# Patient Record
Sex: Female | Born: 1990 | Race: Black or African American | Hispanic: No | Marital: Single | Smoking: Never smoker
Health system: Southern US, Community
[De-identification: ages and names within clinical notes are randomized; demographics above are authoritative.]

## PROBLEM LIST (undated history)

## (undated) ENCOUNTER — Inpatient Hospital Stay (HOSPITAL_COMMUNITY): Payer: Self-pay

## (undated) DIAGNOSIS — Z789 Other specified health status: Secondary | ICD-10-CM

## (undated) DIAGNOSIS — I10 Essential (primary) hypertension: Secondary | ICD-10-CM

## (undated) DIAGNOSIS — Z3490 Encounter for supervision of normal pregnancy, unspecified, unspecified trimester: Secondary | ICD-10-CM

## (undated) HISTORY — PX: TONGUE SURGERY: SHX810

## (undated) HISTORY — DX: Essential (primary) hypertension: I10

## (undated) HISTORY — DX: Other specified health status: Z78.9

---

## 2015-09-12 ENCOUNTER — Encounter (HOSPITAL_COMMUNITY): Payer: Self-pay | Admitting: Emergency Medicine

## 2015-09-12 ENCOUNTER — Emergency Department (HOSPITAL_COMMUNITY): Payer: Self-pay

## 2015-09-12 ENCOUNTER — Emergency Department (HOSPITAL_COMMUNITY)
Admission: EM | Admit: 2015-09-12 | Discharge: 2015-09-12 | Disposition: A | Discharge status: Home / Self Care | Payer: Self-pay | Attending: Emergency Medicine | Admitting: Emergency Medicine

## 2015-09-12 DIAGNOSIS — S4991XA Unspecified injury of right shoulder and upper arm, initial encounter: Secondary | ICD-10-CM | POA: Insufficient documentation

## 2015-09-12 DIAGNOSIS — O9A212 Injury, poisoning and certain other consequences of external causes complicating pregnancy, second trimester: Secondary | ICD-10-CM | POA: Insufficient documentation

## 2015-09-12 DIAGNOSIS — Z3201 Encounter for pregnancy test, result positive: Secondary | ICD-10-CM

## 2015-09-12 DIAGNOSIS — Z3492 Encounter for supervision of normal pregnancy, unspecified, second trimester: Secondary | ICD-10-CM

## 2015-09-12 DIAGNOSIS — Y92410 Unspecified street and highway as the place of occurrence of the external cause: Secondary | ICD-10-CM | POA: Insufficient documentation

## 2015-09-12 DIAGNOSIS — Z3A16 16 weeks gestation of pregnancy: Secondary | ICD-10-CM | POA: Insufficient documentation

## 2015-09-12 DIAGNOSIS — S199XXA Unspecified injury of neck, initial encounter: Secondary | ICD-10-CM | POA: Insufficient documentation

## 2015-09-12 DIAGNOSIS — Y998 Other external cause status: Secondary | ICD-10-CM | POA: Insufficient documentation

## 2015-09-12 DIAGNOSIS — S3991XA Unspecified injury of abdomen, initial encounter: Secondary | ICD-10-CM | POA: Insufficient documentation

## 2015-09-12 DIAGNOSIS — S4992XA Unspecified injury of left shoulder and upper arm, initial encounter: Secondary | ICD-10-CM | POA: Insufficient documentation

## 2015-09-12 DIAGNOSIS — Y9389 Activity, other specified: Secondary | ICD-10-CM | POA: Insufficient documentation

## 2015-09-12 HISTORY — DX: Encounter for supervision of normal pregnancy, unspecified, unspecified trimester: Z34.90

## 2015-09-12 LAB — CBC WITH DIFFERENTIAL/PLATELET
Basophils Absolute: 0 10*3/uL (ref 0.0–0.1)
Basophils Relative: 0 %
EOS ABS: 0.4 10*3/uL (ref 0.0–0.7)
Eosinophils Relative: 5 %
HEMATOCRIT: 33.5 % — AB (ref 36.0–46.0)
HEMOGLOBIN: 10.9 g/dL — AB (ref 12.0–15.0)
LYMPHS ABS: 2.1 10*3/uL (ref 0.7–4.0)
Lymphocytes Relative: 27 %
MCH: 27 pg (ref 26.0–34.0)
MCHC: 32.5 g/dL (ref 30.0–36.0)
MCV: 83.1 fL (ref 78.0–100.0)
MONO ABS: 0.4 10*3/uL (ref 0.1–1.0)
MONOS PCT: 6 %
NEUTROS ABS: 4.9 10*3/uL (ref 1.7–7.7)
NEUTROS PCT: 62 %
Platelets: 249 10*3/uL (ref 150–400)
RBC: 4.03 MIL/uL (ref 3.87–5.11)
RDW: 13.1 % (ref 11.5–15.5)
WBC: 7.8 10*3/uL (ref 4.0–10.5)

## 2015-09-12 LAB — URINALYSIS, ROUTINE W REFLEX MICROSCOPIC
BILIRUBIN URINE: NEGATIVE
Glucose, UA: NEGATIVE mg/dL
KETONES UR: NEGATIVE mg/dL
Leukocytes, UA: NEGATIVE
NITRITE: NEGATIVE
PH: 6 (ref 5.0–8.0)
Protein, ur: NEGATIVE mg/dL
Specific Gravity, Urine: 1.019 (ref 1.005–1.030)

## 2015-09-12 LAB — URINE MICROSCOPIC-ADD ON

## 2015-09-12 LAB — COMPREHENSIVE METABOLIC PANEL
ALK PHOS: 55 U/L (ref 38–126)
ALT: 10 U/L — ABNORMAL LOW (ref 14–54)
ANION GAP: 9 (ref 5–15)
AST: 13 U/L — ABNORMAL LOW (ref 15–41)
Albumin: 3.3 g/dL — ABNORMAL LOW (ref 3.5–5.0)
BILIRUBIN TOTAL: 0.2 mg/dL — AB (ref 0.3–1.2)
BUN: 7 mg/dL (ref 6–20)
CALCIUM: 8.8 mg/dL — AB (ref 8.9–10.3)
CO2: 21 mmol/L — ABNORMAL LOW (ref 22–32)
Chloride: 105 mmol/L (ref 101–111)
Creatinine, Ser: 0.48 mg/dL (ref 0.44–1.00)
GFR calc non Af Amer: >60 mL/min (ref >60)
Glucose, Bld: 101 mg/dL — ABNORMAL HIGH (ref 65–99)
Potassium: 3.4 mmol/L — ABNORMAL LOW (ref 3.5–5.1)
Sodium: 135 mmol/L (ref 135–145)
TOTAL PROTEIN: 6.6 g/dL (ref 6.5–8.1)

## 2015-09-12 LAB — HCG, QUANTITATIVE, PREGNANCY: HCG, BETA CHAIN, QUANT, S: 19022 m[IU]/mL — AB (ref <5)

## 2015-09-12 LAB — LIPASE, BLOOD: LIPASE: 25 U/L (ref 11–51)

## 2015-09-12 MED ORDER — ACETAMINOPHEN 325 MG PO TABS: 325.0000 mg | ORAL_TABLET | Freq: Four times a day (QID) | ORAL | Status: DC | PRN

## 2015-09-12 MED ORDER — ACETAMINOPHEN 325 MG PO TABS
325.0000 mg | ORAL_TABLET | Freq: Once | ORAL | Status: AC
  Administered 2015-09-12: 325 mg via ORAL
  Filled 2015-09-12: qty 1

## 2015-09-12 NOTE — ED Notes (Signed)
Bed: [id number] Expected date:  Expected time:  Means of arrival:  Comments: Hold for RM 27: Dr.Gramig coming
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
## 2015-09-13 NOTE — L&D Delivery Note (Signed)
[name] ; [name] , 9; [name] , via shultz.  Cord:3 vc  with the following complications: none.  Cord pH: n/a  Anesthesia:  none Episiotomy:  none Lacerations:  none Suture Repair: none Est. Blood Loss100 (mL):    Mom to postpartum.  Baby to Couplet care / Skin to Skin.  Cindy [name] 01/31/2016, 8:34 PM

## 2015-10-06 ENCOUNTER — Inpatient Hospital Stay (HOSPITAL_COMMUNITY)
Admission: AD | Admit: 2015-10-06 | Discharge: 2015-10-06 | Disposition: A | Discharge status: Home / Self Care | Payer: Medicaid Other | Source: Ambulatory Visit | Attending: Obstetrics and Gynecology | Admitting: Obstetrics and Gynecology

## 2015-10-06 ENCOUNTER — Encounter (HOSPITAL_COMMUNITY): Payer: Self-pay | Admitting: *Deleted

## 2015-10-06 DIAGNOSIS — O26892 Other specified pregnancy related conditions, second trimester: Secondary | ICD-10-CM | POA: Insufficient documentation

## 2015-10-06 DIAGNOSIS — R12 Heartburn: Secondary | ICD-10-CM | POA: Diagnosis not present

## 2015-10-06 DIAGNOSIS — O9989 Other specified diseases and conditions complicating pregnancy, childbirth and the puerperium: Secondary | ICD-10-CM

## 2015-10-06 DIAGNOSIS — R109 Unspecified abdominal pain: Secondary | ICD-10-CM | POA: Diagnosis present

## 2015-10-06 DIAGNOSIS — Z3A20 20 weeks gestation of pregnancy: Secondary | ICD-10-CM | POA: Diagnosis not present

## 2015-10-06 DIAGNOSIS — O26899 Other specified pregnancy related conditions, unspecified trimester: Secondary | ICD-10-CM

## 2015-10-06 LAB — URINE MICROSCOPIC-ADD ON

## 2015-10-06 LAB — URINALYSIS, ROUTINE W REFLEX MICROSCOPIC
BILIRUBIN URINE: NEGATIVE
GLUCOSE, UA: NEGATIVE mg/dL
Ketones, ur: NEGATIVE mg/dL
NITRITE: NEGATIVE
PH: 5 (ref 5.0–8.0)
Protein, ur: NEGATIVE mg/dL

## 2015-10-06 MED ORDER — FAMOTIDINE 20 MG PO TABS: 20.0000 mg | ORAL_TABLET | Freq: Two times a day (BID) | ORAL | Status: DC

## 2015-10-06 NOTE — MAU Provider Note (Signed)
History     CSN: [id number]  Arrival date and time: 10/06/15 1744   First Provider Initiated Contact with Patient 10/06/15 1919         Chief Complaint  Patient presents with  . Abdominal Pain  . Heartburn   Abdominal Cramping Episode onset: x 1 week. The problem occurs 2 to 4 times per day. The problem has been unchanged. The pain is located in the suprapubic region, LLQ and RLQ. The pain is at a severity of 0/10. The patient is experiencing no pain. The quality of the pain is cramping. The abdominal pain does not radiate. Pertinent negatives include no constipation, diarrhea, dysuria, fever, frequency, hematuria, nausea or vomiting. Nothing aggravates the pain. She has tried nothing for the symptoms.  Heartburn She complains of abdominal pain and heartburn. She reports no chest pain, no coughing or no nausea. Episode onset: x 1 week. The problem occurs frequently. The problem has been unchanged. The heartburn duration is an hour. The heartburn is located in the abdomen (epigastric). The heartburn does not wake her from sleep. The heartburn does not limit her activity. The heartburn doesn't change with position. The symptoms are aggravated by certain foods and lying down. Risk factors include caffeine use and obesity. She has tried nothing for the symptoms.   OB History    Gravida Para Term Preterm AB TAB SAB Ectopic Multiple Living   1 0              Past Medical History  Diagnosis Date  . Pregnancy     Past Surgical History  Procedure Laterality Date  . Tongue surgery      when pt was 25 years old    History reviewed. No pertinent family history.  Social History  Substance Use Topics  . Smoking status: Never Smoker   . Smokeless tobacco: None  . Alcohol Use: No     Comment: occ    Allergies: No Known Allergies  Prescriptions prior to admission  Medication Sig Dispense Refill Last Dose  . acetaminophen (TYLENOL) 325 MG tablet Take 1 tablet (325 mg total) by mouth  every 6 (six) hours as needed. 30 tablet 0     Review of Systems  Constitutional: Negative.  Negative for fever.  Respiratory: Negative for cough.   Cardiovascular: Negative for chest pain.  Gastrointestinal: Positive for heartburn and abdominal pain. Negative for nausea, vomiting, diarrhea and constipation.       No symptoms today  Genitourinary: Negative.  Negative for dysuria, frequency and hematuria.   Physical Exam   Blood pressure 132/72, pulse 88, temperature 98.4 F (36.9 C), temperature source Oral, resp. rate 18, height  (1.702 m), weight 240 lb (108.863 kg), last menstrual period 06/12/2015.  Physical Exam  Nursing note and vitals reviewed. Constitutional: She is oriented to person, place, and time. She appears well-developed and well-nourished. No distress.  HENT:  Head: Normocephalic and atraumatic.  Eyes: Conjunctivae are normal. Right eye exhibits no discharge. Left eye exhibits no discharge. No scleral icterus.  Neck: Normal range of motion.  Cardiovascular: Normal rate, regular rhythm and normal heart sounds.   No murmur heard. Respiratory: Effort normal and breath sounds normal. No respiratory distress. She has no wheezes.  GI: Soft. Bowel sounds are normal. There is no tenderness.  Neurological: She is alert and oriented to person, place, and time.  Skin: Skin is warm and dry. She is not diaphoretic.  Psychiatric: She has a normal mood and affect. Her behavior  is normal. Judgment and thought content normal.   Dilation: Closed Effacement (%): Thick Cervical Position: Posterior Station: -3 Exam by:: Judeth Horn NP  MAU Course  Procedures Results for orders placed or performed during the hospital encounter of 10/06/15 (from the past 24 hour(s))  Urinalysis, Routine w reflex microscopic (not at [facility])     Status: Abnormal   Collection Time: 10/06/15  6:11 PM  Result Value Ref Range   Color, Urine STRAW (A) YELLOW   APPearance CLEAR CLEAR   Specific  Gravity, Urine <1.005 (L) 1.005 - 1.030   pH 5.0 5.0 - 8.0   Glucose, UA NEGATIVE NEGATIVE mg/dL   Hgb urine dipstick SMALL (A) NEGATIVE   Bilirubin Urine NEGATIVE NEGATIVE   Ketones, ur NEGATIVE NEGATIVE mg/dL   Protein, ur NEGATIVE NEGATIVE mg/dL   Nitrite NEGATIVE NEGATIVE   Leukocytes, UA SMALL (A) NEGATIVE  Urine microscopic-add on     Status: Abnormal   Collection Time: 10/06/15  6:11 PM  Result Value Ref Range   Squamous Epithelial / LPF 0-5 (A) NONE SEEN   WBC, UA 6-30 0 - 5 WBC/hpf   RBC / HPF 6-30 0 - 5 RBC/hpf   Bacteria, UA FEW (A) NONE SEEN    MDM FHT 145 by doppler Patient denies any symptoms at this time Cervix closed Assessment and Plan  A: 1. Heartburn during pregnancy in second trimester   2. Abdominal cramping affecting pregnancy    P: Discharge home Message sent to clinic to schedule prenatal care Discussed reasons to return to MAU Rx pepcid Discussed diet changes to improve heartburn symptoms Outpatient anatomy ultrasound ordered  Judeth Horn, NP  10/06/2015, 7:18 PM

## 2015-10-06 NOTE — MAU Note (Signed)
Pt states started having intermittent cramping in her lower abdomen  and bad heartburn for 1 week.

## 2015-10-06 NOTE — Discharge Instructions (Signed)
Heartburn During Pregnancy Heartburn is a burning sensation in the chest caused by stomach acid backing up into the esophagus. Heartburn is common in pregnancy because a certain hormone (progesterone) is released when a woman is pregnant. The progesterone hormone may relax the valve that separates the esophagus from the stomach. This allows acid to go up into the esophagus, causing heartburn. Heartburn may also happen in pregnancy because the enlarging uterus pushes up on the stomach, which pushes more acid into the esophagus. This is especially true in the later stages of pregnancy. Heartburn problems usually go away after giving birth. CAUSES  Heartburn is caused by stomach acid backing up into the esophagus. During pregnancy, this may result from various things, including:   The progesterone hormone.  Changing hormone levels.  The growing uterus pushing stomach acid upward.  Large meals.  Certain foods and drinks.  Exercise.  Increased acid production. SIGNS AND SYMPTOMS   Burning pain in the chest or lower throat.  Bitter taste in the mouth.  Coughing. DIAGNOSIS  Your health care provider will typically diagnose heartburn by taking a careful history of your concern. Blood tests may be done to check for a certain type of bacteria that is associated with heartburn. Sometimes, heartburn is diagnosed by prescribing a heartburn medicine to see if the symptoms improve. In some cases, a procedure called an endoscopy may be done. In this procedure, a tube with a light and a camera on the end (endoscope) is used to examine the esophagus and the stomach. TREATMENT  Treatment will vary depending on the severity of your symptoms. Your health care provider may recommend:  Over-the-counter medicines (antacids, acid reducers) for mild heartburn.  Prescription medicines to decrease stomach acid or to protect your stomach lining.  Certain changes in your diet.  Elevating the head of your bed  by putting blocks under the legs. This helps prevent stomach acid from backing up into the esophagus when you are lying down. HOME CARE INSTRUCTIONS   Only take over-the-counter or prescription medicines as directed by your health care provider.  Raise the head of your bed by putting blocks under the legs if instructed to do so by your health care provider. Sleeping with more pillows is not effective because it only changes the position of your head.  Do not exercise right after eating.  Avoid eating 2-3 hours before bed. Do not lie down right after eating.  Eat small meals throughout the day instead of three large meals.  Identify foods and beverages that make your symptoms worse and avoid them. Foods you may want to avoid include:  Peppers.  Chocolate.  High-fat foods, including fried foods.  Spicy foods.  Garlic and onions.  Citrus fruits, including oranges, grapefruit, lemons, and limes.  Food containing tomatoes or tomato products.  Mint.  Carbonated and caffeinated drinks.  Vinegar. SEEK MEDICAL CARE IF:  You have abdominal pain of any kind.  You feel burning in your upper abdomen or chest, especially after eating or lying down.  You have nausea and vomiting.  Your stomach feels upset after you eat. SEEK IMMEDIATE MEDICAL CARE IF:   You have severe chest pain that goes down your arm or into your jaw or neck.  You feel sweaty, dizzy, or light-headed.  You become short of breath.  You vomit blood.  You have difficulty or pain with swallowing.  You have bloody or black, tarry stools.  You have episodes of heartburn more than 3 times a week,  for more than 2 weeks. MAKE SURE YOU:  Understand these instructions.  Will watch your condition.  Will get help right away if you are not doing well or get worse.   This information is not intended to replace advice given to you by your health care provider. Make sure you discuss any questions you have with  your health care provider.   Document Released: 08/26/2000 Document Revised: 09/19/2014 Document Reviewed: 04/17/2013 [employer] Interactive Patient Education 2016 [employer] Second Trimester of Pregnancy The second trimester is from week 13 through week 28, months 4 through 6. The second trimester is often a time when you feel your best. Your body has also adjusted to being pregnant, and you begin to feel better physically. Usually, morning sickness has lessened or quit completely, you may have more energy, and you may have an increase in appetite. The second trimester is also a time when the fetus is growing rapidly. At the end of the sixth month, the fetus is about 9 inches long and weighs about 1 pounds. You will likely begin to feel the baby move (quickening) between 18 and 20 weeks of the pregnancy. BODY CHANGES Your body goes through many changes during pregnancy. The changes vary from woman to woman.   Your weight will continue to increase. You will notice your lower abdomen bulging out.  You may begin to get stretch marks on your hips, abdomen, and breasts.  You may develop headaches that can be relieved by medicines approved by your health care provider.  You may urinate more often because the fetus is pressing on your bladder.  You may develop or continue to have heartburn as a result of your pregnancy.  You may develop constipation because certain hormones are causing the muscles that push waste through your intestines to slow down.  You may develop hemorrhoids or swollen, bulging veins (varicose veins).  You may have back pain because of the weight gain and pregnancy hormones relaxing your joints between the bones in your pelvis and as a result of a shift in weight and the muscles that support your balance.  Your breasts will continue to grow and be tender.  Your gums may bleed and may be sensitive to brushing and flossing.  Dark spots or blotches (chloasma, mask of  pregnancy) may develop on your face. This will likely fade after the baby is born.  A dark line from your belly button to the pubic area (linea nigra) may appear. This will likely fade after the baby is born.  You may have changes in your hair. These can include thickening of your hair, rapid growth, and changes in texture. Some women also have hair loss during or after pregnancy, or hair that feels dry or thin. Your hair will most likely return to normal after your baby is born. WHAT TO EXPECT AT YOUR PRENATAL VISITS During a routine prenatal visit:  You will be weighed to make sure you and the fetus are growing normally.  Your blood pressure will be taken.  Your abdomen will be measured to track your baby's growth.  The fetal heartbeat will be listened to.  Any test results from the previous visit will be discussed. Your health care provider may ask you:  How you are feeling.  If you are feeling the baby move.  If you have had any abnormal symptoms, such as leaking fluid, bleeding, severe headaches, or abdominal cramping.  If you are using any tobacco products, including cigarettes, chewing tobacco, and   electronic cigarettes.  If you have any questions. Other tests that may be performed during your second trimester include:  Blood tests that check for:  Low iron levels (anemia).  Gestational diabetes (between 24 and 28 weeks).  Rh antibodies.  Urine tests to check for infections, diabetes, or protein in the urine.  An ultrasound to confirm the proper growth and development of the baby.  An amniocentesis to check for possible genetic problems.  Fetal screens for spina bifida and Down syndrome.  HIV (human immunodeficiency virus) testing. Routine prenatal testing includes screening for HIV, unless you choose not to have this test. HOME CARE INSTRUCTIONS   Avoid all smoking, herbs, alcohol, and unprescribed drugs. These chemicals affect the formation and growth of the  baby.  Do not use any tobacco products, including cigarettes, chewing tobacco, and electronic cigarettes. If you need help quitting, ask your health care provider. You may receive counseling support and other resources to help you quit.  Follow your health care provider's instructions regarding medicine use. There are medicines that are either safe or unsafe to take during pregnancy.  Exercise only as directed by your health care provider. Experiencing uterine cramps is a good sign to stop exercising.  Continue to eat regular, healthy meals.  Wear a good support bra for breast tenderness.  Do not use hot tubs, steam rooms, or saunas.  Wear your seat belt at all times when driving.  Avoid raw meat, uncooked cheese, cat litter boxes, and soil used by cats. These carry germs that can cause birth defects in the baby.  Take your prenatal vitamins.  Take 1500-2000 mg of calcium daily starting at the 20th week of pregnancy until you deliver your baby.  Try taking a stool softener (if your health care provider approves) if you develop constipation. Eat more high-fiber foods, such as fresh vegetables or fruit and whole grains. Drink plenty of fluids to keep your urine clear or pale yellow.  Take warm sitz baths to soothe any pain or discomfort caused by hemorrhoids. Use hemorrhoid cream if your health care provider approves.  If you develop varicose veins, wear support hose. Elevate your feet for 15 minutes, 3-4 times a day. Limit salt in your diet.  Avoid heavy lifting, wear low heel shoes, and practice good posture.  Rest with your legs elevated if you have leg cramps or low back pain.  Visit your dentist if you have not gone yet during your pregnancy. Use a soft toothbrush to brush your teeth and be gentle when you floss.  A sexual relationship may be continued unless your health care provider directs you otherwise.  Continue to go to all your prenatal visits as directed by your health  care provider. SEEK MEDICAL CARE IF:   You have dizziness.  You have mild pelvic cramps, pelvic pressure, or nagging pain in the abdominal area.  You have persistent nausea, vomiting, or diarrhea.  You have a bad smelling vaginal discharge.  You have pain with urination. SEEK IMMEDIATE MEDICAL CARE IF:   You have a fever.  You are leaking fluid from your vagina.  You have spotting or bleeding from your vagina.  You have severe abdominal cramping or pain.  You have rapid weight gain or loss.  You have shortness of breath with chest pain.  You notice sudden or extreme swelling of your face, hands, ankles, feet, or legs.  You have not felt your baby move in over an hour.  You have severe headaches that   do not go away with medicine.  You have vision changes.   This information is not intended to replace advice given to you by your health care provider. Make sure you discuss any questions you have with your health care provider.   Document Released: 08/23/2001 Document Revised: 09/19/2014 Document Reviewed: 10/30/2012 [employer] Interactive Patient Education 2016 [employer]  

## 2015-10-09 LAB — CULTURE, OB URINE: Culture: >=100000

## 2015-10-10 ENCOUNTER — Telehealth: Payer: Self-pay | Admitting: Obstetrics and Gynecology

## 2015-10-10 MED ORDER — CEPHALEXIN 500 MG PO CAPS: 500.0000 mg | ORAL_CAPSULE | Freq: Four times a day (QID) | ORAL | Status: DC

## 2015-10-11 NOTE — Telephone Encounter (Signed)
Patient returned call to [facility]. Advised of UTI and Rx at pharmacy. Advised to increase PO hydration and warning signs for pyelonephritis. Patient voiced understanding.   Marny Lowenstein, PA-C 10/11/2015 3:12 PM

## 2015-10-22 ENCOUNTER — Ambulatory Visit (INDEPENDENT_AMBULATORY_CARE_PROVIDER_SITE_OTHER): Payer: Medicaid Other | Admitting: Advanced Practice Midwife

## 2015-10-22 ENCOUNTER — Other Ambulatory Visit (HOSPITAL_COMMUNITY)
Admission: RE | Admit: 2015-10-22 | Discharge: 2015-10-22 | Disposition: A | Discharge status: Home / Self Care | Payer: Medicaid Other | Source: Ambulatory Visit | Attending: Advanced Practice Midwife | Admitting: Advanced Practice Midwife

## 2015-10-22 ENCOUNTER — Encounter: Payer: Self-pay | Admitting: Advanced Practice Midwife

## 2015-10-22 VITALS — BP 136/65 | HR 99 | Temp 98.4°F | Wt 275.0 lb

## 2015-10-22 DIAGNOSIS — Z113 Encounter for screening for infections with a predominantly sexual mode of transmission: Secondary | ICD-10-CM

## 2015-10-22 DIAGNOSIS — Z01419 Encounter for gynecological examination (general) (routine) without abnormal findings: Secondary | ICD-10-CM | POA: Diagnosis not present

## 2015-10-22 DIAGNOSIS — Z124 Encounter for screening for malignant neoplasm of cervix: Secondary | ICD-10-CM

## 2015-10-22 DIAGNOSIS — Z3402 Encounter for supervision of normal first pregnancy, second trimester: Secondary | ICD-10-CM | POA: Diagnosis present

## 2015-10-22 DIAGNOSIS — Z23 Encounter for immunization: Secondary | ICD-10-CM | POA: Diagnosis not present

## 2015-10-22 DIAGNOSIS — O2602 Excessive weight gain in pregnancy, second trimester: Secondary | ICD-10-CM | POA: Diagnosis not present

## 2015-10-22 DIAGNOSIS — Z3492 Encounter for supervision of normal pregnancy, unspecified, second trimester: Secondary | ICD-10-CM

## 2015-10-22 LAB — POCT URINALYSIS DIP (DEVICE)
Bilirubin Urine: NEGATIVE
Glucose, UA: NEGATIVE mg/dL
KETONES UR: NEGATIVE mg/dL
NITRITE: NEGATIVE
PH: 5.5 (ref 5.0–8.0)
PROTEIN: NEGATIVE mg/dL
Specific Gravity, Urine: 1.015 (ref 1.005–1.030)
UROBILINOGEN UA: 0.2 mg/dL (ref 0.0–1.0)

## 2015-10-22 NOTE — Patient Instructions (Signed)
Second Trimester of Pregnancy The second trimester is from week 13 through week 28, months 4 through 6. The second trimester is often a time when you feel your best. Your body has also adjusted to being pregnant, and you begin to feel better physically. Usually, morning sickness has lessened or quit completely, you may have more energy, and you may have an increase in appetite. The second trimester is also a time when the fetus is growing rapidly. At the end of the sixth month, the fetus is about 9 inches long and weighs about 1 pounds. You will likely begin to feel the baby move (quickening) between 18 and 20 weeks of the pregnancy. BODY CHANGES Your body goes through many changes during pregnancy. The changes vary from woman to woman.   Your weight will continue to increase. You will notice your lower abdomen bulging out.  You may begin to get stretch marks on your hips, abdomen, and breasts.  You may develop headaches that can be relieved by medicines approved by your health care provider.  You may urinate more often because the fetus is pressing on your bladder.  You may develop or continue to have heartburn as a result of your pregnancy.  You may develop constipation because certain hormones are causing the muscles that push waste through your intestines to slow down.  You may develop hemorrhoids or swollen, bulging veins (varicose veins).  You may have back pain because of the weight gain and pregnancy hormones relaxing your joints between the bones in your pelvis and as a result of a shift in weight and the muscles that support your balance.  Your breasts will continue to grow and be tender.  Your gums may bleed and may be sensitive to brushing and flossing.  Dark spots or blotches (chloasma, mask of pregnancy) may develop on your face. This will likely fade after the baby is born.  A dark line from your belly button to the pubic area (linea nigra) may appear. This will likely  fade after the baby is born.  You may have changes in your hair. These can include thickening of your hair, rapid growth, and changes in texture. Some women also have hair loss during or after pregnancy, or hair that feels dry or thin. Your hair will most likely return to normal after your baby is born. WHAT TO EXPECT AT YOUR PRENATAL VISITS During a routine prenatal visit:  You will be weighed to make sure you and the fetus are growing normally.  Your blood pressure will be taken.  Your abdomen will be measured to track your baby's growth.  The fetal heartbeat will be listened to.  Any test results from the previous visit will be discussed. Your health care provider may ask you:  How you are feeling.  If you are feeling the baby move.  If you have had any abnormal symptoms, such as leaking fluid, bleeding, severe headaches, or abdominal cramping.  If you are using any tobacco products, including cigarettes, chewing tobacco, and electronic cigarettes.  If you have any questions. Other tests that may be performed during your second trimester include:  Blood tests that check for:  Low iron levels (anemia).  Gestational diabetes (between 24 and 28 weeks).  Rh antibodies.  Urine tests to check for infections, diabetes, or protein in the urine.  An ultrasound to confirm the proper growth and development of the baby.  An amniocentesis to check for possible genetic problems.  Fetal screens for spina bifida   and Down syndrome.  HIV (human immunodeficiency virus) testing. Routine prenatal testing includes screening for HIV, unless you choose not to have this test. HOME CARE INSTRUCTIONS   Avoid all smoking, herbs, alcohol, and unprescribed drugs. These chemicals affect the formation and growth of the baby.  Do not use any tobacco products, including cigarettes, chewing tobacco, and electronic cigarettes. If you need help quitting, ask your health care provider. You may receive  counseling support and other resources to help you quit.  Follow your health care provider's instructions regarding medicine use. There are medicines that are either safe or unsafe to take during pregnancy.  Exercise only as directed by your health care provider. Experiencing uterine cramps is a good sign to stop exercising.  Continue to eat regular, healthy meals.  Wear a good support bra for breast tenderness.  Do not use hot tubs, steam rooms, or saunas.  Wear your seat belt at all times when driving.  Avoid raw meat, uncooked cheese, cat litter boxes, and soil used by cats. These carry germs that can cause birth defects in the baby.  Take your prenatal vitamins.  Take 1500-2000 mg of calcium daily starting at the 20th week of pregnancy until you deliver your baby.  Try taking a stool softener (if your health care provider approves) if you develop constipation. Eat more high-fiber foods, such as fresh vegetables or fruit and whole grains. Drink plenty of fluids to keep your urine clear or pale yellow.  Take warm sitz baths to soothe any pain or discomfort caused by hemorrhoids. Use hemorrhoid cream if your health care provider approves.  If you develop varicose veins, wear support hose. Elevate your feet for 15 minutes, 3-4 times a day. Limit salt in your diet.  Avoid heavy lifting, wear low heel shoes, and practice good posture.  Rest with your legs elevated if you have leg cramps or low back pain.  Visit your dentist if you have not gone yet during your pregnancy. Use a soft toothbrush to brush your teeth and be gentle when you floss.  A sexual relationship may be continued unless your health care provider directs you otherwise.  Continue to go to all your prenatal visits as directed by your health care provider. SEEK MEDICAL CARE IF:   You have dizziness.  You have mild pelvic cramps, pelvic pressure, or nagging pain in the abdominal area.  You have persistent nausea,  vomiting, or diarrhea.  You have a bad smelling vaginal discharge.  You have pain with urination. SEEK IMMEDIATE MEDICAL CARE IF:   You have a fever.  You are leaking fluid from your vagina.  You have spotting or bleeding from your vagina.  You have severe abdominal cramping or pain.  You have rapid weight gain or loss.  You have shortness of breath with chest pain.  You notice sudden or extreme swelling of your face, hands, ankles, feet, or legs.  You have not felt your baby move in over an hour.  You have severe headaches that do not go away with medicine.  You have vision changes.   This information is not intended to replace advice given to you by your health care provider. Make sure you discuss any questions you have with your health care provider.   Document Released: 08/23/2001 Document Revised: 09/19/2014 Document Reviewed: 10/30/2012 Elsevier Interactive Patient Education 2016 [employer]   Breastfeeding Deciding to breastfeed is one of the best choices you can make for you and your baby. A change   in hormones during pregnancy causes your breast tissue to grow and increases the number and size of your milk ducts. These hormones also allow proteins, sugars, and fats from your blood supply to make breast milk in your milk-producing glands. Hormones prevent breast milk from being released before your baby is born as well as prompt milk flow after birth. Once breastfeeding has begun, thoughts of your baby, as well as his or her sucking or crying, can stimulate the release of milk from your milk-producing glands.  BENEFITS OF BREASTFEEDING For Your Baby  Your first milk (colostrum) helps your baby's digestive system function better.  There are antibodies in your milk that help your baby fight off infections.  Your baby has a lower incidence of asthma, allergies, and sudden infant death syndrome.  The nutrients in breast milk are better for your baby than infant  formulas and are designed uniquely for your baby's needs.  Breast milk improves your baby's brain development.  Your baby is less likely to develop other conditions, such as childhood obesity, asthma, or type 2 diabetes mellitus. For You  Breastfeeding helps to create a very special bond between you and your baby.  Breastfeeding is convenient. Breast milk is always available at the correct temperature and costs nothing.  Breastfeeding helps to burn calories and helps you lose the weight gained during pregnancy.  Breastfeeding makes your uterus contract to its prepregnancy size faster and slows bleeding (lochia) after you give birth.   Breastfeeding helps to lower your risk of developing type 2 diabetes mellitus, osteoporosis, and breast or ovarian cancer later in life. SIGNS THAT YOUR BABY IS HUNGRY Early Signs of Hunger  Increased alertness or activity.  Stretching.  Movement of the head from side to side.  Movement of the head and opening of the mouth when the corner of the mouth or cheek is stroked (rooting).  Increased sucking sounds, smacking lips, cooing, sighing, or squeaking.  Hand-to-mouth movements.  Increased sucking of fingers or hands. Late Signs of Hunger  Fussing.  Intermittent crying. Extreme Signs of Hunger Signs of extreme hunger will require calming and consoling before your baby will be able to breastfeed successfully. Do not wait for the following signs of extreme hunger to occur before you initiate breastfeeding:  Restlessness.  A loud, strong cry.  Screaming. BREASTFEEDING BASICS Breastfeeding Initiation  Find a comfortable place to sit or lie down, with your neck and back well supported.  Place a pillow or rolled up blanket under your baby to bring him or her to the level of your breast (if you are seated). Nursing pillows are specially designed to help support your arms and your baby while you breastfeed.  Make sure that your baby's  abdomen is facing your abdomen.  Gently massage your breast. With your fingertips, massage from your chest wall toward your nipple in a circular motion. This encourages milk flow. You may need to continue this action during the feeding if your milk flows slowly.  Support your breast with 4 fingers underneath and your thumb above your nipple. Make sure your fingers are well away from your nipple and your baby's mouth.  Stroke your baby's lips gently with your finger or nipple.  When your baby's mouth is open wide enough, quickly bring your baby to your breast, placing your entire nipple and as much of the colored area around your nipple (areola) as possible into your baby's mouth.  More areola should be visible above your baby's upper lip than   below the lower lip.  Your baby's tongue should be between his or her lower gum and your breast.  Ensure that your baby's mouth is correctly positioned around your nipple (latched). Your baby's lips should create a seal on your breast and be turned out (everted).  It is common for your baby to suck about 2-3 minutes in order to start the flow of breast milk. Latching Teaching your baby how to latch on to your breast properly is very important. An improper latch can cause nipple pain and decreased milk supply for you and poor weight gain in your baby. Also, if your baby is not latched onto your nipple properly, he or she may swallow some air during feeding. This can make your baby fussy. Burping your baby when you switch breasts during the feeding can help to get rid of the air. However, teaching your baby to latch on properly is still the best way to prevent fussiness from swallowing air while breastfeeding. Signs that your baby has successfully latched on to your nipple:  Silent tugging or silent sucking, without causing you pain.  Swallowing heard between every 3-4 sucks.  Muscle movement above and in front of his or her ears while sucking. Signs  that your baby has not successfully latched on to nipple:  Sucking sounds or smacking sounds from your baby while breastfeeding.  Nipple pain. If you think your baby has not latched on correctly, slip your finger into the corner of your baby's mouth to break the suction and place it between your baby's gums. Attempt breastfeeding initiation again. Signs of Successful Breastfeeding Signs from your baby:  A gradual decrease in the number of sucks or complete cessation of sucking.  Falling asleep.  Relaxation of his or her body.  Retention of a small amount of milk in his or her mouth.  Letting go of your breast by himself or herself. Signs from you:  Breasts that have increased in firmness, weight, and size 1-3 hours after feeding.  Breasts that are softer immediately after breastfeeding.  Increased milk volume, as well as a change in milk consistency and color by the fifth day of breastfeeding.  Nipples that are not sore, cracked, or bleeding. Signs That Your Baby is Getting Enough Milk  Wetting at least 3 diapers in a 24-hour period. The urine should be clear and pale yellow by age 5 days.  At least 3 stools in a 24-hour period by age 5 days. The stool should be soft and yellow.  At least 3 stools in a 24-hour period by age 7 days. The stool should be seedy and yellow.  No loss of weight greater than 10% of birth weight during the first 3 days of age.  Average weight gain of 4-7 ounces (113-198 g) per week after age 4 days.  Consistent daily weight gain by age 5 days, without weight loss after the age of 2 weeks. After a feeding, your baby may spit up a small amount. This is common. BREASTFEEDING FREQUENCY AND DURATION Frequent feeding will help you make more milk and can prevent sore nipples and breast engorgement. Breastfeed when you feel the need to reduce the fullness of your breasts or when your baby shows signs of hunger. This is called "breastfeeding on demand." Avoid  introducing a pacifier to your baby while you are working to establish breastfeeding (the first 4-6 weeks after your baby is born). After this time you may choose to use a pacifier. Research has shown that   pacifier use during the first year of a baby's life decreases the risk of sudden infant death syndrome (SIDS). Allow your baby to feed on each breast as long as he or she wants. Breastfeed until your baby is finished feeding. When your baby unlatches or falls asleep while feeding from the first breast, offer the second breast. Because newborns are often sleepy in the first few weeks of life, you may need to awaken your baby to get him or her to feed. Breastfeeding times will vary from baby to baby. However, the following rules can serve as a guide to help you ensure that your baby is properly fed:  Newborns (babies 4 weeks of age or younger) may breastfeed every 1-3 hours.  Newborns should not go longer than 3 hours during the day or 5 hours during the night without breastfeeding.  You should breastfeed your baby a minimum of 8 times in a 24-hour period until you begin to introduce solid foods to your baby at around 6 months of age. BREAST MILK PUMPING Pumping and storing breast milk allows you to ensure that your baby is exclusively fed your breast milk, even at times when you are unable to breastfeed. This is especially important if you are going back to work while you are still breastfeeding or when you are not able to be present during feedings. Your lactation consultant can give you guidelines on how long it is safe to store breast milk. A breast pump is a machine that allows you to pump milk from your breast into a sterile bottle. The pumped breast milk can then be stored in a refrigerator or freezer. Some breast pumps are operated by hand, while others use electricity. Ask your lactation consultant which type will work best for you. Breast pumps can be purchased, but some hospitals and  breastfeeding support groups lease breast pumps on a monthly basis. A lactation consultant can teach you how to hand express breast milk, if you prefer not to use a pump. CARING FOR YOUR BREASTS WHILE YOU BREASTFEED Nipples can become dry, cracked, and sore while breastfeeding. The following recommendations can help keep your breasts moisturized and healthy:  Avoid using soap on your nipples.  Wear a supportive bra. Although not required, special nursing bras and tank tops are designed to allow access to your breasts for breastfeeding without taking off your entire bra or top. Avoid wearing underwire-style bras or extremely tight bras.  Air dry your nipples for 3-4minutes after each feeding.  Use only cotton bra pads to absorb leaked breast milk. Leaking of breast milk between feedings is normal.  Use lanolin on your nipples after breastfeeding. Lanolin helps to maintain your skin's normal moisture barrier. If you use pure lanolin, you do not need to wash it off before feeding your baby again. Pure lanolin is not toxic to your baby. You may also hand express a few drops of breast milk and gently massage that milk into your nipples and allow the milk to air dry. In the first few weeks after giving birth, some women experience extremely full breasts (engorgement). Engorgement can make your breasts feel heavy, warm, and tender to the touch. Engorgement peaks within 3-5 days after you give birth. The following recommendations can help ease engorgement:  Completely empty your breasts while breastfeeding or pumping. You may want to start by applying warm, moist heat (in the shower or with warm water-soaked hand towels) just before feeding or pumping. This increases circulation and helps the milk   flow. If your baby does not completely empty your breasts while breastfeeding, pump any extra milk after he or she is finished.  Wear a snug bra (nursing or regular) or tank top for 1-2 days to signal your body  to slightly decrease milk production.  Apply ice packs to your breasts, unless this is too uncomfortable for you.  Make sure that your baby is latched on and positioned properly while breastfeeding. If engorgement persists after 48 hours of following these recommendations, contact your health care provider or a lactation consultant. OVERALL HEALTH CARE RECOMMENDATIONS WHILE BREASTFEEDING  Eat healthy foods. Alternate between meals and snacks, eating 3 of each per day. Because what you eat affects your breast milk, some of the foods may make your baby more irritable than usual. Avoid eating these foods if you are sure that they are negatively affecting your baby.  Drink milk, fruit juice, and water to satisfy your thirst (about 10 glasses a day).  Rest often, relax, and continue to take your prenatal vitamins to prevent fatigue, stress, and anemia.  Continue breast self-awareness checks.  Avoid chewing and smoking tobacco. Chemicals from cigarettes that pass into breast milk and exposure to secondhand smoke may harm your baby.  Avoid alcohol and drug use, including marijuana. Some medicines that may be harmful to your baby can pass through breast milk. It is important to ask your health care provider before taking any medicine, including all over-the-counter and prescription medicine as well as vitamin and herbal supplements. It is possible to become pregnant while breastfeeding. If birth control is desired, ask your health care provider about options that will be safe for your baby. SEEK MEDICAL CARE IF:  You feel like you want to stop breastfeeding or have become frustrated with breastfeeding.  You have painful breasts or nipples.  Your nipples are cracked or bleeding.  Your breasts are red, tender, or warm.  You have a swollen area on either breast.  You have a fever or chills.  You have nausea or vomiting.  You have drainage other than breast milk from your nipples.  Your  breasts do not become full before feedings by the fifth day after you give birth.  You feel sad and depressed.  Your baby is too sleepy to eat well.  Your baby is having trouble sleeping.   Your baby is wetting less than 3 diapers in a 24-hour period.  Your baby has less than 3 stools in a 24-hour period.  Your baby's skin or the white part of his or her eyes becomes yellow.   Your baby is not gaining weight by 5 days of age. SEEK IMMEDIATE MEDICAL CARE IF:  Your baby is overly tired (lethargic) and does not want to wake up and feed.  Your baby develops an unexplained fever.   This information is not intended to replace advice given to you by your health care provider. Make sure you discuss any questions you have with your health care provider.   Document Released: 08/29/2005 Document Revised: 05/20/2015 Document Reviewed: 02/20/2013 Elsevier Interactive Patient Education 2016 [employer] Inc.  

## 2015-10-22 NOTE — Progress Notes (Addendum)
   Subjective:    Cindy [name] is a G1P0000 [redacted]w[redacted]d being seen today for her first obstetrical visit.  Her obstetrical history is significant for obesity. Patient does intend to breast feed. Pregnancy history fully reviewed.  Patient reports no complaints.  Filed Vitals:   10/22/15 1352  BP: 136/65  Pulse: 99  Temp: 98.4 F (36.9 C)  Weight: 275 lb (124.739 kg)    HISTORY: OB History  Gravida Para Term Preterm AB SAB TAB Ectopic Multiple Living     # Outcome Date GA Lbr Len/2nd Weight Sex Delivery Anes PTL Lv  1 Current              Past Medical History  Diagnosis Date  . Pregnancy   . Medical history non-contributory    Past Surgical History  Procedure Laterality Date  . Tongue surgery      when pt was 24 years old   Family History  Problem Relation Age of Onset  . Arthritis Maternal Grandmother   . Hypertension Maternal Grandmother   . Diabetes Maternal Grandfather   . Hypertension Maternal Grandfather      Exam    Uterus:     Pelvic Exam:    Perineum: No Hemorrhoids, Normal Perineum   Vulva: normal   Vagina:  normal mucosa, normal discharge   pH:    Cervix: no bleeding following Pap, no lesions and nulliparous appearance   Adnexa: normal adnexa and no mass, fullness, tenderness   Bony Pelvis: average  System: Breast:  normal appearance, no masses or tenderness   Skin: normal coloration and turgor, no rashes    Neurologic: oriented, normal, normal mood, gait normal; reflexes normal and symmetric   Extremities: normal strength, tone, and muscle mass, ROM of all joints is normal   HEENT sclera clear, anicteric   Mouth/Teeth mucous membranes moist, pharynx normal without lesions and dental hygiene good   Neck supple and no masses   Cardiovascular: regular rate and rhythm   Respiratory:  appears well, vitals normal, no respiratory distress, acyanotic, normal RR, ear and throat exam is normal, neck free of mass or lymphadenopathy, chest  clear, no wheezing, crepitations, rhonchi, normal symmetric air entry   Abdomen: soft, non-tender; bowel sounds normal; no masses,  no organomegaly   Urinary: urethral meatus normal      Assessment:    Pregnancy: G1P0000 Patient Active Problem List   Diagnosis Date Noted  . Supervision of normal first pregnancy in second trimester 10/22/2015        Plan:     1. Supervision of low-risk pregnancy, second trimester  - [place] MFM OB COMP + 14 WK; Future  2. Supervision of normal first pregnancy in second trimester  - Prenatal Vit-Fe Fumarate-FA (MULTIVITAMIN-PRENATAL) 27-0.8 MG TABS tablet; Take 1 tablet by mouth daily at 12 noon. - Prenatal Profile - Hemoglobinopathy evaluation - Culture, OB Urine - Prescript Monitor Profile(19) - Cytology - PAP - GC/Chlamydia probe amp ([place])not at [facility] - Flu Vaccine QUAD 36+ mos IM; Standing - AFP, Quad Screen - Flu Vaccine QUAD 36+ mos IM   LEFTWICH-KIRBY, Chaynce Schafer 10/22/2015

## 2015-10-22 NOTE — Progress Notes (Signed)
Addendum:  Added diagnoses of excessive weight gain in pregnancy, second trimester.  --Pt weight gain of 48 lbs by 22 weeks of pregnancy. --Reviewed recommended weight gain of 15-20 lbs --Discussed healthy pregnancy nutrition and exercise --Reviewed risks of weight gain in pregnancy, including increased risk of DM, LGA infant, and increased risk of C/S.

## 2015-10-23 LAB — PRENATAL PROFILE (SOLSTAS)
ANTIBODY SCREEN: NEGATIVE
BASOS PCT: 0 % (ref 0–1)
Basophils Absolute: 0 10*3/uL (ref 0.0–0.1)
EOS ABS: 0.3 10*3/uL (ref 0.0–0.7)
EOS PCT: 4 % (ref 0–5)
HEMATOCRIT: 32.9 % — AB (ref 36.0–46.0)
HEMOGLOBIN: 10.6 g/dL — AB (ref 12.0–15.0)
HIV 1&2 Ab, 4th Generation: NONREACTIVE
Hepatitis B Surface Ag: NEGATIVE
Lymphocytes Relative: 22 % (ref 12–46)
Lymphs Abs: 1.7 10*3/uL (ref 0.7–4.0)
MCH: 27.1 pg (ref 26.0–34.0)
MCHC: 32.2 g/dL (ref 30.0–36.0)
MCV: 84.1 fL (ref 78.0–100.0)
MONO ABS: 0.7 10*3/uL (ref 0.1–1.0)
MPV: 9.5 fL (ref 8.6–12.4)
Monocytes Relative: 9 % (ref 3–12)
Neutro Abs: 5 10*3/uL (ref 1.7–7.7)
Neutrophils Relative %: 65 % (ref 43–77)
Platelets: 251 10*3/uL (ref 150–400)
RBC: 3.91 MIL/uL (ref 3.87–5.11)
RDW: 14.4 % (ref 11.5–15.5)
RH TYPE: POSITIVE
Rubella: 3.53 Index — ABNORMAL HIGH (ref <0.90)
WBC: 7.7 10*3/uL (ref 4.0–10.5)

## 2015-10-23 LAB — PRESCRIPTION MONITORING PROFILE (19 PANEL)
AMPHETAMINE/METH: NEGATIVE ng/mL
BARBITURATE SCREEN, URINE: NEGATIVE ng/mL
Benzodiazepine Screen, Urine: NEGATIVE ng/mL
Buprenorphine, Urine: NEGATIVE ng/mL
COCAINE METABOLITES: NEGATIVE ng/mL
Cannabinoid Scrn, Ur: NEGATIVE ng/mL
Carisoprodol, Urine: NEGATIVE ng/mL
Creatinine, Urine: 155.09 mg/dL (ref >20.0)
Fentanyl, Ur: NEGATIVE ng/mL
MDMA URINE: NEGATIVE ng/mL
Meperidine, Ur: NEGATIVE ng/mL
Methadone Screen, Urine: NEGATIVE ng/mL
Methaqualone: NEGATIVE ng/mL
NITRITES URINE, INITIAL: NEGATIVE ug/mL
OPIATE SCREEN, URINE: NEGATIVE ng/mL
Oxycodone Screen, Ur: NEGATIVE ng/mL
PROPOXYPHENE: NEGATIVE ng/mL
Phencyclidine, Ur: NEGATIVE ng/mL
TRAMADOL UR: NEGATIVE ng/mL
Tapentadol, urine: NEGATIVE ng/mL
ZOLPIDEM, URINE: NEGATIVE ng/mL
pH, Initial: 5.9 pH (ref 4.5–8.9)

## 2015-10-23 LAB — GC/CHLAMYDIA PROBE AMP (~~LOC~~) NOT AT ARMC
Chlamydia: NEGATIVE
NEISSERIA GONORRHEA: NEGATIVE

## 2015-10-23 LAB — CULTURE, OB URINE

## 2015-10-23 LAB — CYTOLOGY - PAP

## 2015-10-23 NOTE — Progress Notes (Signed)
Late entry: 10/23/15 0848 Completed Medicaid Home form.

## 2015-10-26 LAB — AFP, QUAD SCREEN
AFP: 72.4 ng/mL
Age Alone: 1:1120 {titer}
Curr Gest Age: 22.3 wks.days
HCG TOTAL: 7.02 [IU]/mL
INH: 135.9 pg/mL
INTERPRETATION-AFP: NEGATIVE
MOM FOR INH: 0.87
MoM for AFP: 1.31
MoM for hCG: 0.59
OPEN SPINA BIFIDA: NEGATIVE
Osb Risk: 1:9720 {titer}
Tri 18 Scr Risk Est: NEGATIVE
Trisomy 18 (Edward) Syndrome Interp.: 1:35600 {titer}
uE3 Mom: 0.97
uE3 Value: 2.13 ng/mL

## 2015-10-26 LAB — HEMOGLOBINOPATHY EVALUATION
HEMOGLOBIN OTHER: 0 %
HGB A2 QUANT: 2.7 % (ref 2.2–3.2)
HGB F QUANT: 0 % (ref 0.0–2.0)
HGB S QUANTITAION: 0 %
Hgb A: 97.3 % (ref 96.8–97.8)

## 2015-10-27 ENCOUNTER — Ambulatory Visit (HOSPITAL_COMMUNITY): Payer: Medicaid Other

## 2015-10-28 ENCOUNTER — Other Ambulatory Visit: Payer: Self-pay | Admitting: General Practice

## 2015-10-28 ENCOUNTER — Ambulatory Visit (HOSPITAL_COMMUNITY)
Admission: RE | Admit: 2015-10-28 | Discharge: 2015-10-28 | Disposition: A | Discharge status: Home / Self Care | Payer: Medicaid Other | Source: Ambulatory Visit | Attending: Advanced Practice Midwife | Admitting: Advanced Practice Midwife

## 2015-10-28 DIAGNOSIS — O0932 Supervision of pregnancy with insufficient antenatal care, second trimester: Secondary | ICD-10-CM | POA: Insufficient documentation

## 2015-10-28 DIAGNOSIS — Z6841 Body Mass Index (BMI) 40.0 and over, adult: Secondary | ICD-10-CM

## 2015-10-28 DIAGNOSIS — O99212 Obesity complicating pregnancy, second trimester: Secondary | ICD-10-CM | POA: Insufficient documentation

## 2015-10-28 DIAGNOSIS — Z3492 Encounter for supervision of normal pregnancy, unspecified, second trimester: Secondary | ICD-10-CM

## 2015-10-28 DIAGNOSIS — Z3687 Encounter for antenatal screening for uncertain dates: Secondary | ICD-10-CM

## 2015-10-28 DIAGNOSIS — Z36 Encounter for antenatal screening of mother: Secondary | ICD-10-CM | POA: Insufficient documentation

## 2015-10-28 DIAGNOSIS — Z3402 Encounter for supervision of normal first pregnancy, second trimester: Secondary | ICD-10-CM

## 2015-10-28 DIAGNOSIS — Z3A23 23 weeks gestation of pregnancy: Secondary | ICD-10-CM | POA: Diagnosis not present

## 2015-10-28 DIAGNOSIS — O283 Abnormal ultrasonic finding on antenatal screening of mother: Secondary | ICD-10-CM

## 2015-11-13 ENCOUNTER — Inpatient Hospital Stay (HOSPITAL_COMMUNITY)
Admission: AD | Admit: 2015-11-13 | Discharge: 2015-11-13 | Disposition: A | Discharge status: Home / Self Care | Payer: Medicaid Other | Source: Ambulatory Visit | Attending: Obstetrics and Gynecology | Admitting: Obstetrics and Gynecology

## 2015-11-13 ENCOUNTER — Encounter (HOSPITAL_COMMUNITY): Payer: Self-pay | Admitting: *Deleted

## 2015-11-13 DIAGNOSIS — O368120 Decreased fetal movements, second trimester, not applicable or unspecified: Secondary | ICD-10-CM | POA: Diagnosis not present

## 2015-11-13 DIAGNOSIS — O368190 Decreased fetal movements, unspecified trimester, not applicable or unspecified: Secondary | ICD-10-CM

## 2015-11-13 DIAGNOSIS — Z3A25 25 weeks gestation of pregnancy: Secondary | ICD-10-CM | POA: Insufficient documentation

## 2015-11-13 LAB — URINE MICROSCOPIC-ADD ON

## 2015-11-13 LAB — URINALYSIS, ROUTINE W REFLEX MICROSCOPIC
BILIRUBIN URINE: NEGATIVE
GLUCOSE, UA: NEGATIVE mg/dL
Ketones, ur: NEGATIVE mg/dL
Nitrite: NEGATIVE
PROTEIN: NEGATIVE mg/dL
Specific Gravity, Urine: 1.02 (ref 1.005–1.030)
pH: 5 (ref 5.0–8.0)

## 2015-11-13 NOTE — Discharge Instructions (Signed)
Fetal Movement Counts  Patient Name: __________________________________________________ Patient Due Date: ____________________  Performing a fetal movement count is highly recommended in high-risk pregnancies, but it is good for every pregnant woman to do. Your health care provider may ask you to start counting fetal movements at 28 weeks of the pregnancy. Fetal movements often increase:  · After eating a full meal.  · After physical activity.  · After eating or drinking something sweet or cold.  · At rest.  Pay attention to when you feel the baby is most active. This will help you notice a pattern of your baby's sleep and wake cycles and what factors contribute to an increase in fetal movement. It is important to perform a fetal movement count at the same time each day when your baby is normally most active.   HOW TO COUNT FETAL MOVEMENTS  1. Find a quiet and comfortable area to sit or lie down on your left side. Lying on your left side provides the best blood and oxygen circulation to your baby.  2. Write down the day and time on a sheet of paper or in a journal.  3. Start counting kicks, flutters, swishes, rolls, or jabs in a 2-hour period. You should feel at least 10 movements within 2 hours.  4. If you do not feel 10 movements in 2 hours, wait 2-3 hours and count again. Look for a change in the pattern or not enough counts in 2 hours.  SEEK MEDICAL CARE IF:  · You feel less than 10 counts in 2 hours, tried twice.  · There is no movement in over an hour.  · The pattern is changing or taking longer each day to reach 10 counts in 2 hours.  · You feel the baby is not moving as he or she usually does.  Date: ____________ Movements: ____________ Start time: ____________ Finish time: ____________   Date: ____________ Movements: ____________ Start time: ____________ Finish time: ____________  Date: ____________ Movements: ____________ Start time: ____________ Finish time: ____________  Date: ____________ Movements:  ____________ Start time: ____________ Finish time: ____________  Date: ____________ Movements: ____________ Start time: ____________ Finish time: ____________  Date: ____________ Movements: ____________ Start time: ____________ Finish time: ____________  Date: ____________ Movements: ____________ Start time: ____________ Finish time: ____________  Date: ____________ Movements: ____________ Start time: ____________ Finish time: ____________   Date: ____________ Movements: ____________ Start time: ____________ Finish time: ____________  Date: ____________ Movements: ____________ Start time: ____________ Finish time: ____________  Date: ____________ Movements: ____________ Start time: ____________ Finish time: ____________  Date: ____________ Movements: ____________ Start time: ____________ Finish time: ____________  Date: ____________ Movements: ____________ Start time: ____________ Finish time: ____________  Date: ____________ Movements: ____________ Start time: ____________ Finish time: ____________  Date: ____________ Movements: ____________ Start time: ____________ Finish time: ____________   Date: ____________ Movements: ____________ Start time: ____________ Finish time: ____________  Date: ____________ Movements: ____________ Start time: ____________ Finish time: ____________  Date: ____________ Movements: ____________ Start time: ____________ Finish time: ____________  Date: ____________ Movements: ____________ Start time: ____________ Finish time: ____________  Date: ____________ Movements: ____________ Start time: ____________ Finish time: ____________  Date: ____________ Movements: ____________ Start time: ____________ Finish time: ____________  Date: ____________ Movements: ____________ Start time: ____________ Finish time: ____________   Date: ____________ Movements: ____________ Start time: ____________ Finish time: ____________  Date: ____________ Movements: ____________ Start time: ____________ Finish  time: ____________  Date: ____________ Movements: ____________ Start time: ____________ Finish time: ____________  Date: ____________ Movements: ____________ Start time:   ____________ Finish time: ____________  Date: ____________ Movements: ____________ Start time: ____________ Finish time: ____________  Date: ____________ Movements: ____________ Start time: ____________ Finish time: ____________  Date: ____________ Movements: ____________ Start time: ____________ Finish time: ____________   Date: ____________ Movements: ____________ Start time: ____________ Finish time: ____________  Date: ____________ Movements: ____________ Start time: ____________ Finish time: ____________  Date: ____________ Movements: ____________ Start time: ____________ Finish time: ____________  Date: ____________ Movements: ____________ Start time: ____________ Finish time: ____________  Date: ____________ Movements: ____________ Start time: ____________ Finish time: ____________  Date: ____________ Movements: ____________ Start time: ____________ Finish time: ____________  Date: ____________ Movements: ____________ Start time: ____________ Finish time: ____________   Date: ____________ Movements: ____________ Start time: ____________ Finish time: ____________  Date: ____________ Movements: ____________ Start time: ____________ Finish time: ____________  Date: ____________ Movements: ____________ Start time: ____________ Finish time: ____________  Date: ____________ Movements: ____________ Start time: ____________ Finish time: ____________  Date: ____________ Movements: ____________ Start time: ____________ Finish time: ____________  Date: ____________ Movements: ____________ Start time: ____________ Finish time: ____________  Date: ____________ Movements: ____________ Start time: ____________ Finish time: ____________   Date: ____________ Movements: ____________ Start time: ____________ Finish time: ____________  Date: ____________  Movements: ____________ Start time: ____________ Finish time: ____________  Date: ____________ Movements: ____________ Start time: ____________ Finish time: ____________  Date: ____________ Movements: ____________ Start time: ____________ Finish time: ____________  Date: ____________ Movements: ____________ Start time: ____________ Finish time: ____________  Date: ____________ Movements: ____________ Start time: ____________ Finish time: ____________  Date: ____________ Movements: ____________ Start time: ____________ Finish time: ____________   Date: ____________ Movements: ____________ Start time: ____________ Finish time: ____________  Date: ____________ Movements: ____________ Start time: ____________ Finish time: ____________  Date: ____________ Movements: ____________ Start time: ____________ Finish time: ____________  Date: ____________ Movements: ____________ Start time: ____________ Finish time: ____________  Date: ____________ Movements: ____________ Start time: ____________ Finish time: ____________  Date: ____________ Movements: ____________ Start time: ____________ Finish time: ____________     This information is not intended to replace advice given to you by your health care provider. Make sure you discuss any questions you have with your health care provider.     Document Released: 09/28/2006 Document Revised: 09/19/2014 Document Reviewed: 06/25/2012  Elsevier Interactive Patient Education ©2016 [employer]

## 2015-11-13 NOTE — MAU Note (Signed)
Dr. Ashok PallWouk at bedside with bedside ultrasound machine.

## 2015-11-13 NOTE — MAU Note (Signed)
Urine sent to lab 

## 2015-11-13 NOTE — MAU Note (Signed)
Patient felt baby move some yesterday none today, [facility]

## 2015-11-13 NOTE — Progress Notes (Signed)
Provider notified of pt arrival MAU.  Notified of G1P0 at 636w4d.  Notified that blood pressure is 137/80 with pulse of 90.  Notified that pt came in with complaints of decreased fetal movement but it currently on the FHR monitor.  Pt also mentioned vomiting one time two days ago but states that is not why she came in to MAU.  Dr. Ashok PallWouk states he will see the pt shortly.

## 2015-11-13 NOTE — MAU Provider Note (Signed)
MAU HISTORY AND PHYSICAL  Chief Complaint:  Decreased Fetal Movement   Cindy KinsmanQuionte Mcglynn is a 25 y.o.  G1P0000 with IUP at 9110w4d presenting for Decreased Fetal Movement  Less than normal. Some in morning and some in evening. Previously more active. Today and yesterday. No LOF,no bleeding, no pain, no contractions. Otherwise feeling well. NOrmal appetite. No dysuria.    Past Medical History  Diagnosis Date  . Pregnancy   . Medical history non-contributory     Past Surgical History  Procedure Laterality Date  . Tongue surgery      when pt was 25 years old    Family History  Problem Relation Age of Onset  . Arthritis Maternal Grandmother   . Hypertension Maternal Grandmother   . Diabetes Maternal Grandfather   . Hypertension Maternal Grandfather     Social History  Substance Use Topics  . Smoking status: Never Smoker   . Smokeless tobacco: None  . Alcohol Use: No     Comment: occ    Allergies  Allergen Reactions  . Latex Itching and Other (See Comments)    Causes redness.  . Pineapple Swelling    Swelling in of the throat.  . Nickel Rash    Prescriptions prior to admission  Medication Sig Dispense Refill Last Dose  . Prenatal Vit-Fe Fumarate-FA (MULTIVITAMIN-PRENATAL) 27-0.8 MG TABS tablet Take 1 tablet by mouth daily at 12 noon.   11/12/2015 at Unknown time    Review of Systems - Negative except for what is mentioned in HPI.  Physical Exam  Blood pressure 137/80, pulse 90, temperature 98.2 F (36.8 C), temperature source Oral, resp. rate 16, height 5\' 7"  (1.702 m), weight 272 lb (123.378 kg), last menstrual period 06/12/2015. GENERAL: Well-developed, well-nourished female in no acute distress.  LUNGS: Clear to auscultation bilaterally.  HEART: Regular rate and rhythm. ABDOMEN: Soft, nontender, nondistended, gravid.  EXTREMITIES: Nontender, no edema, 2+ distal pulses.  AFI: 11.58, SDP 5.21 cm   Labs: No results found for this or any previous visit (from the  past 24 hour(s)).  Imaging Studies:  [place] Mfm Ob Detail +14 Wk  10/29/2015  OBSTETRICAL ULTRASOUND: This exam was performed within a [place] [facility]. The OB US report was generated in the AS system, and faxed to the ordering physician.  This report is available in the [employer] PACS. See the [facility] Obstetric US report via the Image Link.   Assessment: Cindy [name] is  25 y.o. G1P0000 at 4510w4d presents with Decreased Fetal Movement Here baby moving, NST reactive for gestational age, and normal afi, so normal modified BPP, which is reassuring. No symptoms pprom or infection.   Plan: - counseled that at [redacted] wks gestation fetal movements can be very subtle - kick counts prn, counseling provided - has u/s in 2 weeks and ob f/u 1 week  Silvano Bilisoah B Kevin Space 3/3/20175:05 PM

## 2015-11-17 ENCOUNTER — Ambulatory Visit (INDEPENDENT_AMBULATORY_CARE_PROVIDER_SITE_OTHER): Payer: Medicaid Other | Admitting: Family

## 2015-11-17 VITALS — BP 124/77 | HR 92 | Temp 98.9°F | Wt 270.2 lb

## 2015-11-17 DIAGNOSIS — O289 Unspecified abnormal findings on antenatal screening of mother: Secondary | ICD-10-CM

## 2015-11-17 DIAGNOSIS — O283 Abnormal ultrasonic finding on antenatal screening of mother: Secondary | ICD-10-CM

## 2015-11-17 DIAGNOSIS — Z3402 Encounter for supervision of normal first pregnancy, second trimester: Secondary | ICD-10-CM

## 2015-11-17 LAB — POCT URINALYSIS DIP (DEVICE)
Bilirubin Urine: NEGATIVE
GLUCOSE, UA: NEGATIVE mg/dL
KETONES UR: NEGATIVE mg/dL
Leukocytes, UA: NEGATIVE
Nitrite: NEGATIVE
PH: 6 (ref 5.0–8.0)
PROTEIN: NEGATIVE mg/dL
Specific Gravity, Urine: 1.015 (ref 1.005–1.030)
UROBILINOGEN UA: 0.2 mg/dL (ref 0.0–1.0)

## 2015-11-17 NOTE — Progress Notes (Signed)
Subjective:  Cindy [name] is a 25 y.o. G1P0000 at 1058w1d being seen today for ongoing prenatal care.  She is currently monitored for the following issues for this low-risk pregnancy and has Supervision of normal first pregnancy in second trimester and Excessive weight gain during pregnancy in second trimester on her problem list.  Patient reports no complaints.  Pt considering waterbirth.   Contractions: Not present. Vag. Bleeding: None.  Movement: Present. Denies leaking of fluid.   The following portions of the patient's history were reviewed and updated as appropriate: allergies, current medications, past family history, past medical history, past social history, past surgical history and problem list. Problem list updated.  Objective:   Filed Vitals:   11/17/15 1128  BP: 124/77  Pulse: 92  Temp: 98.9 F (37.2 C)  Weight: 270 lb 3.2 oz (122.562 kg)    Fetal Status: Fetal Heart Rate (bpm): 155 Fundal Height: 28 cm Movement: Present     General:  Alert, oriented and cooperative. Patient is in no acute distress.  Skin: Skin is warm and dry. No rash noted.   Cardiovascular: Normal heart rate noted  Respiratory: Normal respiratory effort, no problems with respiration noted  Abdomen: Soft, gravid, appropriate for gestational age. Pain/Pressure: Absent     Pelvic: Vag. Bleeding: None     Cervical exam deferred        Extremities: Normal range of motion.  Edema: Mild pitting, slight indentation  Mental Status: Normal mood and affect. Normal behavior. Normal judgment and thought content.   Urinalysis: Urine Protein: Negative Urine Glucose: Negative  Assessment and Plan:  Pregnancy: G1P0000 at 4158w1d  1. 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 Please refer to After Visit Summary for other counseling recommendations.  Return in about 2 weeks (around 12/01/2015).   Eino FarberWalidah Kennith GainN Karim, CNM

## 2015-11-17 NOTE — Patient Instructions (Signed)
Thinking About Doren Custard???  You must attend a Doren Custard class at [facility]  3rd Wednesday of every month from 7-9pm  Free  [employer] by calling [phone] or online at [email or web address].[email or web address]  Bring [place] the certificate from the class  Waterbirth supplies needed for [facility] Department patients:  Our practice has a Heritage manager in a Box tub at the hospital that you can borrow  You will need to purchase an accessory kit that has all needed supplies through [facility] [phone]) or online $175.00  Or you can purchase the supplies separately: o Single-use disposable tub liner for Birth Pool in a Box (REGULAR size) o New garden hose labeled "lead-free", "suitable for drinking water", o Electric drain pump to remove water (We recommend 792 gallon per hour or greater pump.)  o  "non-toxic" OR "water potable" o Garden hose to remove the dirty water o Fish net o Bathing suit top (optional) o Long-handled mirror (optional)  [employer] sells tubs for ~ $120 if you would rather purchase your own tub.  They also sell accessories, liners.    Www.waterbirthsolutions.com for tub purchases and supplies  [employer] (www.thelaborladies.com) $275 for tub rental/set-up & take down/kit   [employer] information regarding doulas (labor support) who provide pool rentals:  IdentityList.se.htm   [employer] (www.thelaborladies.com)  IdentityList.se.htm   Things that would prevent you from having a waterbirth:  Premature, <37wks  Previous cesarean birth  Presence of thick meconium-stained fluid  Multiple gestation (Twins, triplets, etc.)  Uncontrolled diabetes or gestational diabetes requiring medication  Hypertension  Heavy vaginal bleeding  Non-reassuring fetal heart rate  Active infection (MRSA, etc.)  If your labor has to be induced and induction  method requires continuous monitoring of the baby's heart rate  Other risks/issues identified by your obstetrical provider

## 2015-11-17 NOTE — Progress Notes (Signed)
Breastfeeding tip of the week reviewed. 

## 2015-11-25 ENCOUNTER — Encounter (HOSPITAL_COMMUNITY): Payer: Self-pay

## 2015-11-25 ENCOUNTER — Inpatient Hospital Stay (HOSPITAL_COMMUNITY)
Admission: AD | Admit: 2015-11-25 | Discharge: 2015-11-25 | Disposition: A | Discharge status: Home / Self Care | Payer: Medicaid Other | Source: Ambulatory Visit | Attending: Obstetrics and Gynecology | Admitting: Obstetrics and Gynecology

## 2015-11-25 DIAGNOSIS — O4702 False labor before 37 completed weeks of gestation, second trimester: Secondary | ICD-10-CM | POA: Insufficient documentation

## 2015-11-25 DIAGNOSIS — J069 Acute upper respiratory infection, unspecified: Secondary | ICD-10-CM

## 2015-11-25 DIAGNOSIS — K5901 Slow transit constipation: Secondary | ICD-10-CM

## 2015-11-25 DIAGNOSIS — Z3A27 27 weeks gestation of pregnancy: Secondary | ICD-10-CM | POA: Insufficient documentation

## 2015-11-25 LAB — CBC WITH DIFFERENTIAL/PLATELET
BASOS ABS: 0 10*3/uL (ref 0.0–0.1)
Basophils Relative: 0 %
EOS PCT: 3 %
Eosinophils Absolute: 0.2 10*3/uL (ref 0.0–0.7)
HEMATOCRIT: 33 % — AB (ref 36.0–46.0)
Hemoglobin: 10.7 g/dL — ABNORMAL LOW (ref 12.0–15.0)
LYMPHS ABS: 1.9 10*3/uL (ref 0.7–4.0)
LYMPHS PCT: 21 %
MCH: 27.2 pg (ref 26.0–34.0)
MCHC: 32.4 g/dL (ref 30.0–36.0)
MCV: 84 fL (ref 78.0–100.0)
Monocytes Absolute: 0.5 10*3/uL (ref 0.1–1.0)
Monocytes Relative: 5 %
NEUTROS ABS: 6.2 10*3/uL (ref 1.7–7.7)
Neutrophils Relative %: 71 %
PLATELETS: 240 10*3/uL (ref 150–400)
RBC: 3.93 MIL/uL (ref 3.87–5.11)
RDW: 13.3 % (ref 11.5–15.5)
WBC: 8.8 10*3/uL (ref 4.0–10.5)

## 2015-11-25 LAB — URINALYSIS, ROUTINE W REFLEX MICROSCOPIC
BILIRUBIN URINE: NEGATIVE
GLUCOSE, UA: NEGATIVE mg/dL
KETONES UR: NEGATIVE mg/dL
NITRITE: NEGATIVE
PROTEIN: NEGATIVE mg/dL
Specific Gravity, Urine: 1.02 (ref 1.005–1.030)
pH: 5.5 (ref 5.0–8.0)

## 2015-11-25 LAB — URINE MICROSCOPIC-ADD ON

## 2015-11-25 LAB — BASIC METABOLIC PANEL
ANION GAP: 8 (ref 5–15)
BUN: 8 mg/dL (ref 6–20)
CHLORIDE: 104 mmol/L (ref 101–111)
CO2: 23 mmol/L (ref 22–32)
Calcium: 8.6 mg/dL — ABNORMAL LOW (ref 8.9–10.3)
Creatinine, Ser: 0.45 mg/dL (ref 0.44–1.00)
GFR calc Af Amer: >60 mL/min (ref >60)
GFR calc non Af Amer: >60 mL/min (ref >60)
GLUCOSE: 95 mg/dL (ref 65–99)
POTASSIUM: 4.2 mmol/L (ref 3.5–5.1)
Sodium: 135 mmol/L (ref 135–145)

## 2015-11-25 LAB — FETAL FIBRONECTIN: Fetal Fibronectin: NEGATIVE

## 2015-11-25 MED ORDER — GUAIFENESIN ER 600 MG PO TB12
600.0000 mg | ORAL_TABLET | Freq: Two times a day (BID) | ORAL | Status: DC
Start: 2015-11-25 — End: 2015-12-02

## 2015-11-25 MED ORDER — POLYETHYLENE GLYCOL 3350 17 G PO PACK: 17.0000 g | PACK | Freq: Every day | ORAL | Status: DC

## 2015-11-25 MED ORDER — NIFEDIPINE 10 MG PO CAPS
10.0000 mg | ORAL_CAPSULE | Freq: Once | ORAL | Status: AC
  Administered 2015-11-25: 10 mg via ORAL
  Filled 2015-11-25: qty 1

## 2015-11-25 MED ORDER — LACTATED RINGERS IV SOLN
Freq: Once | INTRAVENOUS | Status: AC
  Administered 2015-11-25: 17:00:00 via INTRAVENOUS

## 2015-11-25 NOTE — Discharge Instructions (Signed)
Braxton Hicks Contractions °Contractions of the uterus can occur throughout pregnancy. Contractions are not always a sign that you are in labor.  °WHAT ARE BRAXTON HICKS CONTRACTIONS?  °Contractions that occur before labor are called Braxton Hicks contractions, or false labor. Toward the end of pregnancy (32-34 weeks), these contractions can develop more often and may become more forceful. This is not true labor because these contractions do not result in opening (dilatation) and thinning of the cervix. They are sometimes difficult to tell apart from true labor because these contractions can be forceful and people have different pain tolerances. You should not feel embarrassed if you go to the hospital with false labor. Sometimes, the only way to tell if you are in true labor is for your health care provider to look for changes in the cervix. °If there are no prenatal problems or other health problems associated with the pregnancy, it is completely safe to be sent home with false labor and await the onset of true labor. °HOW CAN YOU TELL THE DIFFERENCE BETWEEN TRUE AND FALSE LABOR? °False Labor °· The contractions of false labor are usually shorter and not as hard as those of true labor.   °· The contractions are usually irregular.   °· The contractions are often felt in the front of the lower abdomen and in the groin.   °· The contractions may go away when you walk around or change positions while lying down.   °· The contractions get weaker and are shorter lasting as time goes on.   °· The contractions do not usually become progressively stronger, regular, and closer together as with true labor.   °True Labor °· Contractions in true labor last 30-70 seconds, become very regular, usually become more intense, and increase in frequency.   °· The contractions do not go away with walking.   °· The discomfort is usually felt in the top of the uterus and spreads to the lower abdomen and low back.   °· True labor can be  determined by your health care provider with an exam. This will show that the cervix is dilating and getting thinner.   °WHAT TO REMEMBER °· Keep up with your usual exercises and follow other instructions given by your health care provider.   °· Take medicines as directed by your health care provider.   °· Keep your regular prenatal appointments.   °· Eat and drink lightly if you think you are going into labor.   °· If Braxton Hicks contractions are making you uncomfortable:   °¨ Change your position from lying down or resting to walking, or from walking to resting.   °¨ Sit and rest in a tub of warm water.   °¨ Drink 2-3 glasses of water. Dehydration may cause these contractions.   °¨ Do slow and deep breathing several times an hour.   °WHEN SHOULD I SEEK IMMEDIATE MEDICAL CARE? °Seek immediate medical care if: °· Your contractions become stronger, more regular, and closer together.   °· You have fluid leaking or gushing from your vagina.   °· You have a fever.   °· You pass blood-tinged mucus.   °· You have vaginal bleeding.   °· You have continuous abdominal pain.   °· You have low back pain that you never had before.   °· You feel your baby's head pushing down and causing pelvic pressure.   °· Your baby is not moving as much as it used to.   °  °This information is not intended to replace advice given to you by your health care provider. Make sure you discuss any questions you have with your health care   provider.   Document Released: 08/29/2005 Document Revised: 09/03/2013 Document Reviewed: 06/10/2013 Elsevier Interactive Patient Education 2016 [employer].  Constipation, Adult Constipation is when a person:  Poops (has a bowel movement) less than 3 times a week.  Has a hard time pooping.  Has poop that is dry, hard, or bigger than normal. HOME CARE   Eat foods with a lot of fiber in them. This includes fruits, vegetables, beans, and whole grains such as brown rice.  Avoid fatty foods and  foods with a lot of sugar. This includes french fries, hamburgers, cookies, candy, and soda.  If you are not getting enough fiber from food, take products with added fiber in them (supplements).  Drink enough fluid to keep your pee (urine) clear or pale yellow.  Exercise on a regular basis, or as told by your doctor.  Go to the restroom when you feel like you need to poop. Do not hold it.  Only take medicine as told by your doctor. Do not take medicines that help you poop (laxatives) without talking to your doctor first. GET HELP RIGHT AWAY IF:   You have bright red blood in your poop (stool).  Your constipation lasts more than 4 days or gets worse.  You have belly (abdominal) or butt (rectal) pain.  You have thin poop (as thin as a pencil).  You lose weight, and it cannot be explained. MAKE SURE YOU:   Understand these instructions.  Will watch your condition.  Will get help right away if you are not doing well or get worse.   This information is not intended to replace advice given to you by your health care provider. Make sure you discuss any questions you have with your health care provider.   Document Released: 02/15/2008 Document Revised: 09/19/2014 Document Reviewed: 06/10/2013 Elsevier Interactive Patient Education 2016 Else[employer] Inc[employer] Upper Respiratory Infection, Adult Most upper respiratory infections (URIs) are a viral infection of the air passages leading to the lungs. A URI affects the nose, throat, and upper air passages. The most common type of URI is nasopharyngitis and is typically referred to as "the common cold." URIs run their course and usually go away on their own. Most of the time, a URI does not require medical attention, but sometimes a bacterial infection in the upper airways can follow a viral infection. This is called a secondary infection. Sinus and middle ear infections are common types of secondary upper respiratory infections. Bacterial pneumonia  can also complicate a URI. A URI can worsen asthma and chronic obstructive pulmonary disease (COPD). Sometimes, these complications can require emergency medical care and may be life threatening.  CAUSES Almost all URIs are caused by viruses. A virus is a type of germ and can spread from one person to another.  RISKS FACTORS You may be at risk for a URI if:   You smoke.   You have chronic heart or lung disease.  You have a weakened defense (immune) system.   You are very young or very old.   You have nasal allergies or asthma.  You work in crowded or poorly ventilated areas.  You work in health care facilities or schools. SIGNS AND SYMPTOMS  Symptoms typically develop 2-3 days after you come in contact with a cold virus. Most viral URIs last 7-10 days. However, viral URIs from the influenza virus (flu virus) can last 14-18 days and are typically more severe. Symptoms may include:   Runny or stuffy (congested) nose.   Sneezing.  Cough.   Sore throat.   Headache.   Fatigue.   Fever.   Loss of appetite.   Pain in your forehead, behind your eyes, and over your cheekbones (sinus pain).  Muscle aches.  DIAGNOSIS  Your health care provider may diagnose a URI by:  Physical exam.  Tests to check that your symptoms are not due to another condition such as:  Strep throat.  Sinusitis.  Pneumonia.  Asthma. TREATMENT  A URI goes away on its own with time. It cannot be cured with medicines, but medicines may be prescribed or recommended to relieve symptoms. Medicines may help:  Reduce your fever.  Reduce your cough.  Relieve nasal congestion. HOME CARE INSTRUCTIONS   Take medicines only as directed by your health care provider.   Gargle warm saltwater or take cough drops to comfort your throat as directed by your health care provider.  Use a warm mist humidifier or inhale steam from a shower to increase air moisture. This may make it easier to  breathe.  Drink enough fluid to keep your urine clear or pale yellow.   Eat soups and other clear broths and maintain good nutrition.   Rest as needed.   Return to work when your temperature has returned to normal or as your health care provider advises. You may need to stay home longer to avoid infecting others. You can also use a face mask and careful hand washing to prevent spread of the virus.  Increase the usage of your inhaler if you have asthma.   Do not use any tobacco products, including cigarettes, chewing tobacco, or electronic cigarettes. If you need help quitting, ask your health care provider. PREVENTION  The best way to protect yourself from getting a cold is to practice good hygiene.   Avoid oral or hand contact with people with cold symptoms.   Wash your hands often if contact occurs.  There is no clear evidence that vitamin C, vitamin E, echinacea, or exercise reduces the chance of developing a cold. However, it is always recommended to get plenty of rest, exercise, and practice good nutrition.  SEEK MEDICAL CARE IF:   You are getting worse rather than better.   Your symptoms are not controlled by medicine.   You have chills.  You have worsening shortness of breath.  You have brown or red mucus.  You have yellow or brown nasal discharge.  You have pain in your face, especially when you bend forward.  You have a fever.  You have swollen neck glands.  You have pain while swallowing.  You have white areas in the back of your throat. SEEK IMMEDIATE MEDICAL CARE IF:   You have severe or persistent:  Headache.  Ear pain.  Sinus pain.  Chest pain.  You have chronic lung disease and any of the following:  Wheezing.  Prolonged cough.  Coughing up blood.  A change in your usual mucus.  You have a stiff neck.  You have changes in your:  Vision.  Hearing.  Thinking.  Mood. MAKE SURE YOU:   Understand these  instructions.  Will watch your condition.  Will get help right away if you are not doing well or get worse.   This information is not intended to replace advice given to you by your health care provider. Make sure you discuss any questions you have with your health care provider.   Document Released: 02/22/2001 Document Revised: 01/13/2015 Document Reviewed: 12/04/2013 Elsevier Interactive Patient Education 2016 Elsevier  Inc. ° °

## 2015-11-25 NOTE — MAU Provider Note (Signed)
History     CSN: [id number]  Arrival date and time: 11/25/15 1322   First Provider Initiated Contact with Patient 11/25/15 1528      No chief complaint on file.  HPI   Cindy [name] is a 25 yr old [id number] female presenting to the MAU with complaints of constipation for 4 days and RLQ abdominal pain for 2 days. Last BM was 4 days ago. RLQ pain is severe, sharp cramps that have interfered with her sleep. Reports increased nausea and vomiting for 4 days.  States this is new for her pregnancy. Pt reports having a cold-like symptoms for 4-5 days with sore throat, runny nose, nonproductive cough, and body aches. Denies fever, chills, SOB, CP, urinary symptoms, or vaginal bleeding. Denies sick contacts.      Past Medical History  Diagnosis Date  . Pregnancy   . Medical history non-contributory     Past Surgical History  Procedure Laterality Date  . Tongue surgery      when pt was 25 years old    Family History  Problem Relation Age of Onset  . Arthritis Maternal Grandmother   . Hypertension Maternal Grandmother   . Diabetes Maternal Grandfather   . Hypertension Maternal Grandfather     Social History  Substance Use Topics  . Smoking status: Never Smoker   . Smokeless tobacco: None  . Alcohol Use: No     Comment: occ    Allergies:  Allergies  Allergen Reactions  . Latex Itching and Other (See Comments)    Causes redness.  . Pineapple Swelling    Swelling in of the throat.  . Nickel Rash    No prescriptions prior to admission    Review of Systems  Constitutional: Negative for fever and chills.  HENT: Positive for congestion and sore throat.   Respiratory: Negative for cough and shortness of breath.   Cardiovascular: Negative for chest pain and palpitations.  Gastrointestinal: Positive for nausea, vomiting, abdominal pain and constipation. Negative for diarrhea.  Genitourinary: Negative for dysuria, urgency, frequency and hematuria.  Musculoskeletal: Positive  for myalgias.  Neurological: Positive for dizziness and weakness.   Physical Exam   Blood pressure 135/77, pulse 90, temperature 98.5 F (36.9 C), temperature source Oral, resp. rate 17, height 5\' 7"  (1.702 m), weight 121.201 kg (267 lb 3.2 oz), last menstrual period 06/12/2015.  Physical Exam  Constitutional: She is oriented to person, place, and time. She appears well-developed and well-nourished.  HENT:  Head: Normocephalic and atraumatic.  Eyes: Pupils are equal, round, and reactive to light.  Cardiovascular: Normal rate, regular rhythm and normal heart sounds.  Exam reveals no gallop and no friction rub.   No murmur heard. Respiratory: Effort normal and breath sounds normal. No respiratory distress. She has no wheezes. She has no rales.  GI: She exhibits no distension. There is tenderness in the right lower quadrant. There is guarding. There is no rebound and no CVA tenderness.  [redacted] week pregnant with appropriate fundus  Neurological: She is alert and oriented to person, place, and time.  Skin: Skin is warm and dry.  GU: midwife Wynelle BourgeoisMarie Evone Arseneau checked cervix which was closed/50% effaced/ballotable  MAU Course  Procedures  MDM Check CBC, BMP, UA, fetal fibronectin to rule out preterm labor  Assessment and Plan  Patient given fluids and Procardia for contractions. Pain likely due to contraction and/or constipation. Contractions have slowed. Stable for discharge. Given Rx for ceririzine and guaifenesin and miralax. Pt given discharge instructions and warning signs  to return.   Cindy [name] 11/25/2015, 7:34 PM   Seen and examined by me also Agree with note See my note for official documentation [employer], CNM

## 2015-11-25 NOTE — MAU Note (Signed)
Urine sent to lab 

## 2015-11-25 NOTE — MAU Note (Signed)
Pain in lower abd and mid back, worse at night.  Has had URI symptoms (cough, runny nose and sore throat- started about 4 days ago0, no fever. Denies urinary symptoms

## 2015-11-25 NOTE — MAU Provider Note (Signed)
Chief Complaint:  No chief complaint on file.   First Provider Initiated Contact with Patient 11/25/15 1528     HPI  Cindy [name] is a 25 y.o. G1P0000 at [id number] presents to maternity admissions reporting lower abdominal and back pain for several days.  Mostly on right.  Has also had URI symptoms with no fever or chills.  Has had constipation for 4 days. She reports good fetal movement, denies LOF, vaginal bleeding, vaginal itching/burning, urinary symptoms, h/a, dizziness, n/v, diarrhea, or fever/chills.  She denies headache, visual changes.  RN Note: Pain in lower abd and mid back, worse at night. Has had URI symptoms (cough, runny nose and sore throat- started about 4 days ago0, no fever. Denies urinary symptoms        No diarrhea, but has been constipated- last BM was 3 or 4 days ago   Past Medical History: Past Medical History  Diagnosis Date  . Pregnancy   . Medical history non-contributory     Past obstetric history: OB History  Gravida Para Term Preterm AB SAB TAB Ectopic Multiple Living  1 0 0 0 0 0 0 0 0 0     # Outcome Date GA Lbr Len/2nd Weight Sex Delivery Anes PTL Lv  1 Current               Past Surgical History: Past Surgical History  Procedure Laterality Date  . Tongue surgery      when pt was 25years old    Family History: Family History  Problem Relation Age of Onset  . Arthritis Maternal Grandmother   . Hypertension Maternal Grandmother   . Diabetes Maternal Grandfather   . Hypertension Maternal Grandfather     Social History: Social History  Substance Use Topics  . Smoking status: Never Smoker   . Smokeless tobacco: None  . Alcohol Use: No     Comment: occ    Allergies:  Allergies  Allergen Reactions  . Latex Itching and Other (See Comments)    Causes redness.  . Pineapple Swelling    Swelling in of the throat.  . Nickel Rash    Meds:  Prescriptions prior to admission  Medication Sig Dispense Refill Last Dose  . Prenatal  Vit-Fe Fumarate-FA (MULTIVITAMIN-PRENATAL) 27-0.8 MG TABS tablet Take 1 tablet by mouth daily at 12 noon.   Taking    I have reviewed patient's Past Medical Hx, Surgical Hx, Family Hx, Social Hx, medications and allergies.   ROS:  Review of Systems  Constitutional: Negative for fever, chills and fatigue.  HENT: Positive for congestion, rhinorrhea and sneezing.   Respiratory: Negative for shortness of breath and wheezing.   Gastrointestinal: Positive for abdominal pain and constipation. Negative for nausea, vomiting, diarrhea and abdominal distention.  Genitourinary: Negative for dysuria, hematuria, vaginal bleeding, vaginal discharge and pelvic pain.  Musculoskeletal: Positive for back pain. Negative for myalgias.  Neurological: Negative for dizziness.   Other systems negative  Physical Exam  Patient Vitals for the past 24 hrs:  BP Temp Temp src Pulse Resp Height Weight  11/25/15 1355 126/74 mmHg 98.3 F (36.8 C) Oral 88 18 5' 7"  (1.702 m) 267 lb 3.2 oz (121.201 kg)   Constitutional: Well-developed, well-nourished female in no acute distress. + nasal congestion, ears clear Cardiovascular: normal rate and rhythm Respiratory: normal effort, clear to auscultation bilaterally GI: Abd soft, non-tender, gravid appropriate for gestational age.   No rebound or guarding. MS: Extremities nontender, no edema, normal ROM Neurologic: Alert and oriented x  4.  GU: Neg CVAT.  PELVIC EXAM: Cervix firm, posterior, neg CMT, uterus nontender, Fundal Height consistent with dates, adnexa without tenderness, enlargement, or mass  Dilation: Closed Effacement (%): 50 Station: Ballotable Exam by:: Hansel Feinstein CNM      FHT:  Baseline 140 , moderate variability, accelerations present, no decelerations Contractions:  Irregular irritability,    Labs: B/POS/-- (02/09 1446) Results for orders placed or performed during the hospital encounter of 11/25/15 (from the past 24 hour(s))  Urinalysis,  Routine w reflex microscopic (not at [facility])     Status: Abnormal   Collection Time: 11/25/15  1:27 PM  Result Value Ref Range   Color, Urine YELLOW YELLOW   APPearance HAZY (A) CLEAR   Specific Gravity, Urine 1.020 1.005 - 1.030   pH 5.5 5.0 - 8.0   Glucose, UA NEGATIVE NEGATIVE mg/dL   Hgb urine dipstick TRACE (A) NEGATIVE   Bilirubin Urine NEGATIVE NEGATIVE   Ketones, ur NEGATIVE NEGATIVE mg/dL   Protein, ur NEGATIVE NEGATIVE mg/dL   Nitrite NEGATIVE NEGATIVE   Leukocytes, UA MODERATE (A) NEGATIVE  Urine microscopic-add on     Status: Abnormal   Collection Time: 11/25/15  1:27 PM  Result Value Ref Range   Squamous Epithelial / LPF 6-30 (A) NONE SEEN   WBC, UA 0-5 0 - 5 WBC/hpf   RBC / HPF 0-5 0 - 5 RBC/hpf   Bacteria, UA MANY (A) NONE SEEN   Urine-Other MUCOUS PRESENT   Fetal fibronectin     Status: None   Collection Time: 11/25/15  4:02 PM  Result Value Ref Range   Fetal Fibronectin NEGATIVE NEGATIVE  CBC with Differential     Status: Abnormal   Collection Time: 11/25/15  4:17 PM  Result Value Ref Range   WBC 8.8 4.0 - 10.5 K/uL   RBC 3.93 3.87 - 5.11 MIL/uL   Hemoglobin 10.7 (L) 12.0 - 15.0 g/dL   HCT 33.0 (L) 36.0 - 46.0 %   MCV 84.0 78.0 - 100.0 fL   MCH 27.2 26.0 - 34.0 pg   MCHC 32.4 30.0 - 36.0 g/dL   RDW 13.3 11.5 - 15.5 %   Platelets 240 150 - 400 K/uL   Neutrophils Relative % 71 %   Neutro Abs 6.2 1.7 - 7.7 K/uL   Lymphocytes Relative 21 %   Lymphs Abs 1.9 0.7 - 4.0 K/uL   Monocytes Relative 5 %   Monocytes Absolute 0.5 0.1 - 1.0 K/uL   Eosinophils Relative 3 %   Eosinophils Absolute 0.2 0.0 - 0.7 K/uL   Basophils Relative 0 %   Basophils Absolute 0.0 0.0 - 0.1 K/uL  Basic metabolic panel     Status: Abnormal   Collection Time: 11/25/15  4:17 PM  Result Value Ref Range   Sodium 135 135 - 145 mmol/L   Potassium 4.2 3.5 - 5.1 mmol/L   Chloride 104 101 - 111 mmol/L   CO2 23 22 - 32 mmol/L   Glucose, Bld 95 65 - 99 mg/dL   BUN 8 6 - 20 mg/dL    Creatinine, Ser 0.45 0.44 - 1.00 mg/dL   Calcium 8.6 (L) 8.9 - 10.3 mg/dL   GFR calc non Af Amer >60 >60 mL/min   GFR calc Af Amer >60 >60 mL/min   Anion gap 8 5 - 15    Imaging:  [place] Mfm Ob Detail +14 Wk  10/29/2015  OBSTETRICAL ULTRASOUND: This exam was performed within a [place]. The OB  US report was generated in the [facility] system, and faxed to the ordering physician.  This report is available in the [facility]. See the [facility] Obstetric US report via the Image Link.   MAU Course/MDM: I have ordered labs and reviewed results. Fetal fibronectin sent   >>> Negative NST reviewed .  Treatments in MAU included Procardia and IV hydration.    Contractions stopped after two doses of Procardia and IV fluids  Assessment: [facility] at [redacted]w[redacted]d Preterm contractions with no cervical change and negative FFn Constipation Upper Respiratory Infection/common cold.   No criteria met for flu  Plan: Discharge home Preterm Labor precautions and fetal kick counts Routine cold care Rx Mucinex for cold Rx Miralax for constipation Follow up in Office for prenatal visits and recheck of cervix    Medication List    ASK your doctor about these medications        multivitamin-prenatal 27-0.8 MG Tabs tablet  Take 1 tablet by mouth daily at 12 noon.       Pt stable at time of discharge.  Encouraged to return here or to other Urgent Care/ED if she develops worsening of symptoms, increase in pain, fever, or other concerning symptoms.      MHansel FeinsteinCNM, MSN Certified Nurse-Midwife 11/25/2015 3:29 PM

## 2015-11-25 NOTE — MAU Note (Signed)
No diarrhea, but has been constipated- last BM was 3 or 4 days ago

## 2015-11-26 ENCOUNTER — Other Ambulatory Visit (HOSPITAL_COMMUNITY): Payer: Self-pay | Admitting: Maternal and Fetal Medicine

## 2015-11-26 ENCOUNTER — Ambulatory Visit (HOSPITAL_COMMUNITY)
Admission: RE | Admit: 2015-11-26 | Discharge: 2015-11-26 | Disposition: A | Discharge status: Home / Self Care | Payer: Medicaid Other | Source: Ambulatory Visit | Attending: Advanced Practice Midwife | Admitting: Advanced Practice Midwife

## 2015-11-26 ENCOUNTER — Encounter (HOSPITAL_COMMUNITY): Payer: Self-pay

## 2015-11-26 DIAGNOSIS — Z3A27 27 weeks gestation of pregnancy: Secondary | ICD-10-CM | POA: Insufficient documentation

## 2015-11-26 DIAGNOSIS — O358XX0 Maternal care for other (suspected) fetal abnormality and damage, not applicable or unspecified: Secondary | ICD-10-CM | POA: Insufficient documentation

## 2015-11-26 DIAGNOSIS — O99212 Obesity complicating pregnancy, second trimester: Secondary | ICD-10-CM | POA: Diagnosis not present

## 2015-11-26 DIAGNOSIS — O283 Abnormal ultrasonic finding on antenatal screening of mother: Secondary | ICD-10-CM

## 2015-11-26 DIAGNOSIS — O0932 Supervision of pregnancy with insufficient antenatal care, second trimester: Secondary | ICD-10-CM | POA: Diagnosis not present

## 2015-11-26 DIAGNOSIS — O359XX0 Maternal care for (suspected) fetal abnormality and damage, unspecified, not applicable or unspecified: Secondary | ICD-10-CM

## 2015-11-26 LAB — CULTURE, OB URINE: SPECIAL REQUESTS: NORMAL

## 2015-12-02 ENCOUNTER — Encounter (HOSPITAL_COMMUNITY): Payer: Self-pay

## 2015-12-02 ENCOUNTER — Inpatient Hospital Stay (HOSPITAL_COMMUNITY)
Admission: AD | Admit: 2015-12-02 | Discharge: 2015-12-02 | Disposition: A | Discharge status: Home / Self Care | Payer: Medicaid Other | Source: Ambulatory Visit | Attending: Obstetrics and Gynecology | Admitting: Obstetrics and Gynecology

## 2015-12-02 ENCOUNTER — Ambulatory Visit (INDEPENDENT_AMBULATORY_CARE_PROVIDER_SITE_OTHER): Payer: Medicaid Other | Admitting: Family

## 2015-12-02 VITALS — BP 130/78 | HR 100 | Temp 98.5°F | Wt 270.5 lb

## 2015-12-02 DIAGNOSIS — O212 Late vomiting of pregnancy: Secondary | ICD-10-CM | POA: Diagnosis present

## 2015-12-02 DIAGNOSIS — K226 Gastro-esophageal laceration-hemorrhage syndrome: Secondary | ICD-10-CM | POA: Diagnosis not present

## 2015-12-02 DIAGNOSIS — Z3402 Encounter for supervision of normal first pregnancy, second trimester: Secondary | ICD-10-CM | POA: Diagnosis not present

## 2015-12-02 DIAGNOSIS — O283 Abnormal ultrasonic finding on antenatal screening of mother: Secondary | ICD-10-CM

## 2015-12-02 DIAGNOSIS — O99613 Diseases of the digestive system complicating pregnancy, third trimester: Secondary | ICD-10-CM | POA: Insufficient documentation

## 2015-12-02 DIAGNOSIS — O289 Unspecified abnormal findings on antenatal screening of mother: Secondary | ICD-10-CM | POA: Diagnosis not present

## 2015-12-02 DIAGNOSIS — Z23 Encounter for immunization: Secondary | ICD-10-CM

## 2015-12-02 DIAGNOSIS — Z3A28 28 weeks gestation of pregnancy: Secondary | ICD-10-CM | POA: Diagnosis not present

## 2015-12-02 LAB — CBC
HCT: 33 % — ABNORMAL LOW (ref 36.0–46.0)
Hemoglobin: 10.7 g/dL — ABNORMAL LOW (ref 12.0–15.0)
MCH: 26.9 pg (ref 26.0–34.0)
MCHC: 32.4 g/dL (ref 30.0–36.0)
MCV: 82.9 fL (ref 78.0–100.0)
MPV: 10 fL (ref 8.6–12.4)
PLATELETS: 268 10*3/uL (ref 150–400)
RBC: 3.98 MIL/uL (ref 3.87–5.11)
RDW: 13.6 % (ref 11.5–15.5)
WBC: 7.9 10*3/uL (ref 4.0–10.5)

## 2015-12-02 LAB — URINALYSIS, ROUTINE W REFLEX MICROSCOPIC
BILIRUBIN URINE: NEGATIVE
GLUCOSE, UA: NEGATIVE mg/dL
KETONES UR: NEGATIVE mg/dL
Nitrite: NEGATIVE
PROTEIN: NEGATIVE mg/dL
Specific Gravity, Urine: <1.005 — ABNORMAL LOW (ref 1.005–1.030)
pH: 6 (ref 5.0–8.0)

## 2015-12-02 LAB — URINE MICROSCOPIC-ADD ON

## 2015-12-02 NOTE — Discharge Instructions (Signed)
Mallory-Weiss Syndrome °Mallory-Weiss syndrome refers to bleeding from tears in the lining of the tube that connects your throat to your stomach (esophagus). The tears occur at the entrance to your stomach. Usually the bleeding stops by itself after 24-48 hours. Surgery may be needed. This condition is not usually fatal.  °CAUSES °The tears that cause bleeding are often caused by severe or lasting vomiting or coughing.  °RISK FACTORS °· Abusing or drinking too much alcohol. °· Having certain eating disorders, such as bulimia. °SIGNS AND SYMPTOMS °· Vomiting of bright red or black coffee-ground-like material. °· Having black, tarry stools. °· Fainting or experiencing loss of consciousness. °DIAGNOSIS  °The procedure often used to diagnose Mallory-Weiss syndrome is an esophagogastroduodenoscopy (EGD). During an EGD procedure, a small, flexible, tube-like telescope (endoscope) is put into your mouth, passed through your esophagus into your stomach, and then into your small bowel. An EGD will show your health care provider where the tear is.  °TREATMENT  °It is necessary to stop the bleeding as soon as possible. The possible treatments to stop the bleeding include injecting medicine into bleeding areas to block (clot) the blood vessels. If bleeding is significant, it may be necessary to replace the blood lost with a blood transfusion. °HOME CARE INSTRUCTIONS °Treat any conditions that may be causing the lasting or recurrent vomiting or coughing. This may include getting help for alcoholism or an eating disorder, if this applies. °SEEK MEDICAL CARE IF:  °You have nausea or vomiting.  °SEEK IMMEDIATE MEDICAL CARE IF: °· You have persistent dizziness, light-headedness, or fainting. °· Your vomiting returns, or you have vomit that is bright red or looks like black coffee grounds. °· You have bloody or black, tarry-looking stools. °· You have chest pain. °· You cannot eat or drink. °MAKE SURE YOU:  °· Understand these  instructions. °· Will watch your condition. °· Will get help right away if you are not doing well or get worse. °  °This information is not intended to replace advice given to you by your health care provider. Make sure you discuss any questions you have with your health care provider. °  °Document Released: 01/16/2006 Document Revised: 09/19/2014 Document Reviewed: 10/23/2013 °Elsevier Interactive Patient Education ©2016 [employer] ° °

## 2015-12-02 NOTE — Progress Notes (Signed)
Subjective:  Cindy [name] is a 25 y.o. G1P0000 at 1425w2d being seen today for ongoing prenatal care.  She is currently monitored for the following issues for this low-risk pregnancy and has Supervision of normal first pregnancy in second trimester; Excessive weight gain during pregnancy in second trimester; and Abnormal fetal ultrasound on her problem list.  Patient reports no complaints.  Contractions: Irregular.  .  Movement: Present. Denies leaking of fluid.   The following portions of the patient's history were reviewed and updated as appropriate: allergies, current medications, past family history, past medical history, past social history, past surgical history and problem list. Problem list updated.  Objective:   Filed Vitals:   12/02/15 1425  BP: 130/78  Pulse: 100  Temp: 98.5 F (36.9 C)  Weight: 270 lb 8 oz (122.698 kg)    Fetal Status: Fetal Heart Rate (bpm): 145 Fundal Height: 30 cm Movement: Present     General:  Alert, oriented and cooperative. Patient is in no acute distress.  Skin: Skin is warm and dry. No rash noted.   Cardiovascular: Normal heart rate noted  Respiratory: Normal respiratory effort, no problems with respiration noted  Abdomen: Soft, gravid, appropriate for gestational age. Pain/Pressure: Absent     Pelvic:       Cervical exam deferred        Extremities: Normal range of motion.     Mental Status: Normal mood and affect. Normal behavior. Normal judgment and thought content.   Urinalysis:     Urine results not available at discharge.    Assessment and Plan:  Pregnancy: G1P0000 at 7325w2d  1. Supervision of normal first pregnancy in second trimester - Glucose Tolerance, 1 HR (50g) - CBC - RPR - HIV antibody  2. Abnormal fetal ultrasound - Follow-up ultrasound > inferior vermis of cerebellum remains > mild; discussed with patient - Rescan at 35 weeks  Preterm labor symptoms and general obstetric precautions including but not limited to vaginal  bleeding, contractions, leaking of fluid and fetal movement were reviewed in detail with the patient. Please refer to After Visit Summary for other counseling recommendations.  Return in about 2 weeks (around 12/16/2015).   Eino FarberWalidah Kennith GainN Karim, CNM

## 2015-12-02 NOTE — Progress Notes (Signed)
Pt states she was seen in MAU last week for contractions.

## 2015-12-02 NOTE — Progress Notes (Signed)
1 hour gtt  Tdap

## 2015-12-02 NOTE — MAU Note (Signed)
Pt states she was vomiting tonight and noticed some blood in the vomit. States she thinks she was vomiting due to heartburn.

## 2015-12-02 NOTE — MAU Provider Note (Signed)
History     CSN: [id number]  Arrival date and time: 12/02/15 2054   First Provider Initiated Contact with Patient 12/02/15 2226      No chief complaint on file.  HPI Cindy [name] is a 25yo G1 @ 28.2wks who presents for eval of seeing blood in emesis x 1 this evening. Has not had much N/V, but was just a single time today. Denies fever or diarrhea. Feels fine now. 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 Pregnancy   . Medical history non-contributory     Past Surgical History  Procedure Laterality Date  . Tongue surgery      when pt was 25 years old    Family History  Problem Relation Age of Onset  . Arthritis Maternal Grandmother   . Hypertension Maternal Grandmother   . Diabetes Maternal Grandfather   . Hypertension Maternal Grandfather     Social History  Substance Use Topics  . Smoking status: Never Smoker   . Smokeless tobacco: None  . Alcohol Use: No     Comment: occ    Allergies:  Allergies  Allergen Reactions  . Latex Itching and Other (See Comments)    Causes redness.  . Pineapple Swelling    Swelling in of the throat.  . Nickel Rash    Prescriptions prior to admission  Medication Sig Dispense Refill Last Dose  . Cetirizine HCl (ZYRTEC PO) Take 1 tablet by mouth daily. Reported on 12/02/2015   Past Week at Unknown time  . Prenatal Vit-Fe Fumarate-FA (MULTIVITAMIN-PRENATAL) 27-0.8 MG TABS tablet Take 1 tablet by mouth daily at 12 noon.   12/01/2015 at Unknown time  . guaiFENesin (MUCINEX) 600 MG 12 hr tablet Take 1 tablet (600 mg total) by mouth 2 (two) times daily. (Patient not taking: Reported on 11/26/2015) 30 tablet 0 Not Taking  . polyethylene glycol (MIRALAX) packet Take 17 g by mouth daily. (Patient not taking: Reported on  12/02/2015) 14 each 0 Not Taking    ROS Physical Exam   Blood pressure 131/71, pulse 72, temperature 98.4 F (36.9 C), temperature source Oral, resp. rate 16, height  (1.702 m), weight 122.018 kg (269 lb), last menstrual period 06/12/2015, SpO2 98 %.  Physical Exam  Constitutional: She is oriented to person, place, and time. She appears well-developed.  HENT:  Head: Normocephalic.  Neck: Normal range of motion.  Cardiovascular: Normal rate.   Respiratory: Effort normal.  GI:  EFM 140s, +accels, no decels No ctx per toco  Genitourinary:  Exam deferred  Musculoskeletal: Normal range of motion.  Neurological: She is alert and oriented to person, place, and time.  Skin: Skin is warm and dry.  Psychiatric: She has a normal mood and affect. Her behavior is normal. Thought content normal.   Urinalysis    Component Value Date/Time   COLORURINE YELLOW 12/02/2015 2135   APPEARANCEUR CLEAR 12/02/2015 2135   LABSPEC <1.005* 12/02/2015 2135   PHURINE 6.0 12/02/2015 2135   GLUCOSEU NEGATIVE 12/02/2015 2135   HGBUR TRACE* 12/02/2015 2135   BILIRUBINUR NEGATIVE 12/02/2015 2135   KETONESUR NEGATIVE 12/02/2015 2135   PROTEINUR NEGATIVE 12/02/2015 2135   UROBILINOGEN 0.2 11/17/2015 1121   NITRITE  NEGATIVE 12/02/2015 2135   LEUKOCYTESUR SMALL* 12/02/2015 2135   Micro: 6-30 SE, few bact   MAU Course  Procedures  MDM UA ordered  Assessment and Plan  IUP@28 .2wks Mallory Weiss tear  D/C home Declines antiemetic as she has not been having freq N/V F/U in 2 weeks at next scheduled OB visit  Cam HaiSHAW, Bo Teicher CNM 12/02/2015, 10:33 PM

## 2015-12-03 LAB — HIV ANTIBODY (ROUTINE TESTING W REFLEX): HIV: NONREACTIVE

## 2015-12-03 LAB — GLUCOSE TOLERANCE, 1 HOUR (50G) W/O FASTING: Glucose, 1 Hr, gestational: 90 mg/dL (ref <140)

## 2015-12-04 LAB — RPR

## 2015-12-16 ENCOUNTER — Encounter: Payer: Self-pay | Admitting: Family

## 2015-12-16 ENCOUNTER — Ambulatory Visit (INDEPENDENT_AMBULATORY_CARE_PROVIDER_SITE_OTHER): Payer: Medicaid Other | Admitting: Family

## 2015-12-16 VITALS — BP 132/76 | HR 81 | Wt 272.1 lb

## 2015-12-16 DIAGNOSIS — O283 Abnormal ultrasonic finding on antenatal screening of mother: Secondary | ICD-10-CM

## 2015-12-16 DIAGNOSIS — O289 Unspecified abnormal findings on antenatal screening of mother: Secondary | ICD-10-CM

## 2015-12-16 DIAGNOSIS — Z3402 Encounter for supervision of normal first pregnancy, second trimester: Secondary | ICD-10-CM

## 2015-12-16 LAB — POCT URINALYSIS DIP (DEVICE)
Bilirubin Urine: NEGATIVE
Glucose, UA: NEGATIVE mg/dL
KETONES UR: NEGATIVE mg/dL
Nitrite: NEGATIVE
PH: 5 (ref 5.0–8.0)
PROTEIN: NEGATIVE mg/dL
SPECIFIC GRAVITY, URINE: 1.02 (ref 1.005–1.030)
Urobilinogen, UA: 0.2 mg/dL (ref 0.0–1.0)

## 2015-12-16 NOTE — Progress Notes (Signed)
Subjective:  Cindy [name] is a 25 y.o. G1P0000 at 3434w2d being seen today for ongoing prenatal care.  She is currently monitored for the following issues for this low-risk pregnancy and has Supervision of normal first pregnancy in second trimester; Excessive weight gain during pregnancy in second trimester; and Abnormal fetal ultrasound on her problem list.  Patient reports no complaints.  Contractions: Not present. Vag. Bleeding: None.  Movement: Present. Denies leaking of fluid.   The following portions of the patient's history were reviewed and updated as appropriate: allergies, current medications, past family history, past medical history, past social history, past surgical history and problem list. Problem list updated.  Objective:   Filed Vitals:   12/16/15 1350  BP: 132/76  Pulse: 81  Weight: 272 lb 1.6 oz (123.424 kg)    Fetal Status: Fetal Heart Rate (bpm): 140 Fundal Height: 32 cm Movement: Present     General:  Alert, oriented and cooperative. Patient is in no acute distress.  Skin: Skin is warm and dry. No rash noted.   Cardiovascular: Normal heart rate noted  Respiratory: Normal respiratory effort, no problems with respiration noted  Abdomen: Soft, gravid, appropriate for gestational age. Pain/Pressure: Absent     Pelvic: Vag. Bleeding: None     Cervical exam deferred        Extremities: Normal range of motion.  Edema: Trace  Mental Status: Normal mood and affect. Normal behavior. Normal judgment and thought content.   Urinalysis: Urine Protein: Negative Urine Glucose: Negative  Assessment and Plan:  Pregnancy: G1P0000 at 3934w2d  1. Supervision of normal first pregnancy in second trimester - Waterbirth class on 12/30/15 - Reviewed what to bring to hospital  2. Abnormal fetal ultrasound - Schedule follow-up at 35 wks  Preterm labor symptoms and general obstetric precautions including but not limited to vaginal bleeding, contractions, leaking of fluid and fetal  movement were reviewed in detail with the patient. Please refer to After Visit Summary for other counseling recommendations.  Return in about 2 weeks (around 12/30/2015).   Eino FarberWalidah Kennith GainN [name], CNM

## 2015-12-16 NOTE — Progress Notes (Signed)
Pt interested in water birth.

## 2015-12-30 ENCOUNTER — Ambulatory Visit (INDEPENDENT_AMBULATORY_CARE_PROVIDER_SITE_OTHER): Payer: Medicaid Other | Admitting: Family

## 2015-12-30 VITALS — BP 136/85 | HR 80 | Wt 277.0 lb

## 2015-12-30 DIAGNOSIS — O289 Unspecified abnormal findings on antenatal screening of mother: Secondary | ICD-10-CM | POA: Diagnosis not present

## 2015-12-30 DIAGNOSIS — Z3402 Encounter for supervision of normal first pregnancy, second trimester: Secondary | ICD-10-CM | POA: Diagnosis not present

## 2015-12-30 DIAGNOSIS — Z1389 Encounter for screening for other disorder: Secondary | ICD-10-CM

## 2015-12-30 DIAGNOSIS — O283 Abnormal ultrasonic finding on antenatal screening of mother: Secondary | ICD-10-CM

## 2015-12-30 LAB — POCT URINALYSIS DIP (DEVICE)
Bilirubin Urine: NEGATIVE
Glucose, UA: NEGATIVE mg/dL
Ketones, ur: NEGATIVE mg/dL
Leukocytes, UA: NEGATIVE
NITRITE: NEGATIVE
PROTEIN: NEGATIVE mg/dL
SPECIFIC GRAVITY, URINE: 1.01 (ref 1.005–1.030)
UROBILINOGEN UA: 0.2 mg/dL (ref 0.0–1.0)
pH: 6 (ref 5.0–8.0)

## 2015-12-31 NOTE — Progress Notes (Signed)
Subjective:  Cindy [name] is a 25 y.o. G1P0000 at 4619w3d being seen today for ongoing prenatal care.  She is currently monitored for the following issues for this low-risk pregnancy and has Supervision of normal first pregnancy in second trimester; Excessive weight gain during pregnancy in second trimester; and Abnormal fetal ultrasound on her problem list.  Patient reports no complaints.  Contractions: Not present. Vag. Bleeding: None.  Movement: Present. Denies leaking of fluid.   The following portions of the patient's history were reviewed and updated as appropriate: allergies, current medications, past family history, past medical history, past social history, past surgical history and problem list. Problem list updated.  Objective:   Filed Vitals:   12/30/15 1525  BP: 136/85  Pulse: 80  Weight: 277 lb (125.646 kg)    Fetal Status: Fetal Heart Rate (bpm): 135 Fundal Height: 34 cm Movement: Present     General:  Alert, oriented and cooperative. Patient is in no acute distress.  Skin: Skin is warm and dry. No rash noted.   Cardiovascular: Normal heart rate noted  Respiratory: Normal respiratory effort, no problems with respiration noted  Abdomen: Soft, gravid, appropriate for gestational age. Pain/Pressure: Present     Pelvic: Vag. Bleeding: None     Cervical exam deferred        Extremities: Normal range of motion.     Mental Status: Normal mood and affect. Normal behavior. Normal judgment and thought content.   Urinalysis: Urine Protein: Negative Urine Glucose: Negative  Assessment and Plan:  Pregnancy: G1P0000 at 4319w3d  1. Encounter for routine screening for malformation using ultrasonics - US MFM OB FOLLOW UP; Future  2. Supervision of normal first pregnancy in second trimester - Waterbirth class tonight  3. Abnormal fetal ultrasound - Scheduled follow-up  Preterm labor symptoms and general obstetric precautions including but not limited to vaginal bleeding,  contractions, leaking of fluid and fetal movement were reviewed in detail with the patient. Please refer to After Visit Summary for other counseling recommendations.  Return in about 2 weeks (around 01/13/2016).   Eino FarberWalidah Kennith GainN [name], CNM

## 2016-01-13 ENCOUNTER — Ambulatory Visit (HOSPITAL_COMMUNITY)
Admission: RE | Admit: 2016-01-13 | Discharge: 2016-01-13 | Disposition: A | Discharge status: Home / Self Care | Payer: Medicaid Other | Source: Ambulatory Visit | Attending: Family | Admitting: Family

## 2016-01-13 ENCOUNTER — Other Ambulatory Visit: Payer: Self-pay | Admitting: Family

## 2016-01-13 ENCOUNTER — Ambulatory Visit (INDEPENDENT_AMBULATORY_CARE_PROVIDER_SITE_OTHER): Payer: Medicaid Other | Admitting: Obstetrics & Gynecology

## 2016-01-13 ENCOUNTER — Encounter (HOSPITAL_COMMUNITY): Payer: Self-pay

## 2016-01-13 VITALS — BP 141/86 | HR 78 | Wt 287.0 lb

## 2016-01-13 VITALS — BP 139/78 | HR 99 | Wt 283.4 lb

## 2016-01-13 DIAGNOSIS — O359XX0 Maternal care for (suspected) fetal abnormality and damage, unspecified, not applicable or unspecified: Secondary | ICD-10-CM | POA: Insufficient documentation

## 2016-01-13 DIAGNOSIS — K219 Gastro-esophageal reflux disease without esophagitis: Secondary | ICD-10-CM

## 2016-01-13 DIAGNOSIS — O2602 Excessive weight gain in pregnancy, second trimester: Secondary | ICD-10-CM

## 2016-01-13 DIAGNOSIS — R9389 Abnormal findings on diagnostic imaging of other specified body structures: Secondary | ICD-10-CM

## 2016-01-13 DIAGNOSIS — Z3402 Encounter for supervision of normal first pregnancy, second trimester: Secondary | ICD-10-CM | POA: Diagnosis not present

## 2016-01-13 DIAGNOSIS — O99213 Obesity complicating pregnancy, third trimester: Secondary | ICD-10-CM | POA: Diagnosis not present

## 2016-01-13 DIAGNOSIS — O283 Abnormal ultrasonic finding on antenatal screening of mother: Secondary | ICD-10-CM

## 2016-01-13 DIAGNOSIS — Z1389 Encounter for screening for other disorder: Secondary | ICD-10-CM

## 2016-01-13 DIAGNOSIS — O289 Unspecified abnormal findings on antenatal screening of mother: Secondary | ICD-10-CM | POA: Diagnosis not present

## 2016-01-13 DIAGNOSIS — Z3A34 34 weeks gestation of pregnancy: Secondary | ICD-10-CM | POA: Insufficient documentation

## 2016-01-13 DIAGNOSIS — O99613 Diseases of the digestive system complicating pregnancy, third trimester: Secondary | ICD-10-CM

## 2016-01-13 LAB — POCT URINALYSIS DIP (DEVICE)
BILIRUBIN URINE: NEGATIVE
GLUCOSE, UA: NEGATIVE mg/dL
KETONES UR: NEGATIVE mg/dL
NITRITE: NEGATIVE
PH: 6 (ref 5.0–8.0)
Protein, ur: NEGATIVE mg/dL
Specific Gravity, Urine: <=1.005 (ref 1.005–1.030)
Urobilinogen, UA: 0.2 mg/dL (ref 0.0–1.0)

## 2016-01-13 MED ORDER — RANITIDINE HCL 150 MG PO TABS: 150.0000 mg | ORAL_TABLET | Freq: Two times a day (BID) | ORAL | Status: DC

## 2016-01-13 NOTE — Progress Notes (Signed)
Subjective:  Cindy [name] is a 25 y.o. G1P0000 at 3877w2d being seen today for ongoing prenatal care.  She is currently monitored for the following issues for this low-risk pregnancy and has Supervision of normal first pregnancy in second trimester; Excessive weight gain during pregnancy in second trimester; and Abnormal fetal ultrasound on her problem list.  Patient reports no complaints.  Contractions: Not present. Vag. Bleeding: None.  Movement: Present. Denies leaking of fluid.   The following portions of the patient's history were reviewed and updated as appropriate: allergies, current medications, past family history, past medical history, past social history, past surgical history and problem list. Problem list updated.  Objective:   Filed Vitals:   01/13/16 1312  BP: 139/78  Pulse: 99  Weight: 283 lb 6.4 oz (128.549 kg)    Fetal Status: Fetal Heart Rate (bpm): 139   Movement: Present     General:  Alert, oriented and cooperative. Patient is in no acute distress.  Skin: Skin is warm and dry. No rash noted.   Cardiovascular: Normal heart rate noted  Respiratory: Normal respiratory effort, no problems with respiration noted  Abdomen: Soft, gravid, appropriate for gestational age. Pain/Pressure: Present     Pelvic: Vag. Bleeding: None Vag D/C Character: Other (Comment)   Cervical exam deferred        Extremities: Normal range of motion.  Edema: Moderate pitting, indentation subsides rapidly  Mental Status: Normal mood and affect. Normal behavior. Normal judgment and thought content.   Urinalysis: Urine Protein: Negative Urine Glucose: Negative  Assessment and Plan:  Pregnancy: G1P0000 at 5277w2d  1. Excessive weight gain during pregnancy in second trimester  2. Supervision of normal first pregnancy in second trimester  3. Abnormal fetal ultrasound  4. Gastroesophageal reflux during pregnancy, antepartum, third trimester - ranitidine (ZANTAC) 150 MG tablet; Take 1 tablet (150  mg total) by mouth 2 (two) times daily.  Dispense: 60 tablet; Refill: 3  Preterm labor symptoms and general obstetric precautions including but not limited to vaginal bleeding, contractions, leaking of fluid and fetal movement were reviewed in detail with the patient. Please refer to After Visit Summary for other counseling recommendations.  Return in about 2 weeks (around 01/27/2016).   Willodean Rosenthalarolyn Harraway-Smith, MD

## 2016-01-13 NOTE — Patient Instructions (Signed)

## 2016-01-18 IMAGING — US US OB COMP +14 WK
1 series · 14 of 21 positions shown · non-contrast
Comparison: none

CLINICAL DATA: Urine pregnancy positive.  Evaluate pregnancy.

EXAM:
LIMITED OBSTETRIC ULTRASOUND

[Series 1: us ob comp +14 wk · 0.28mm/px · 21 acquisitions, 14 frames shown]
[im 1/21]
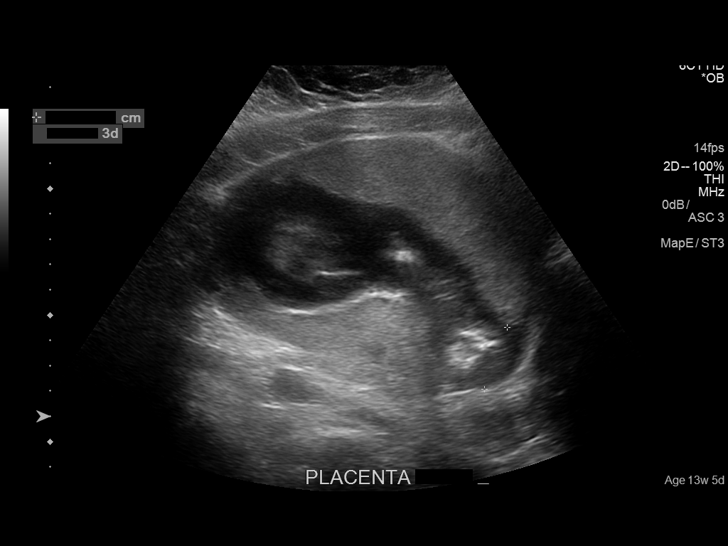
[im 3/21]
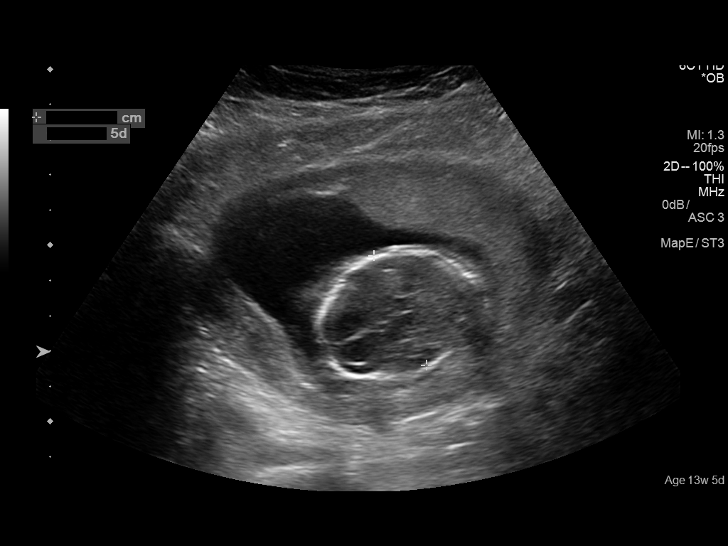
[im 4/21]
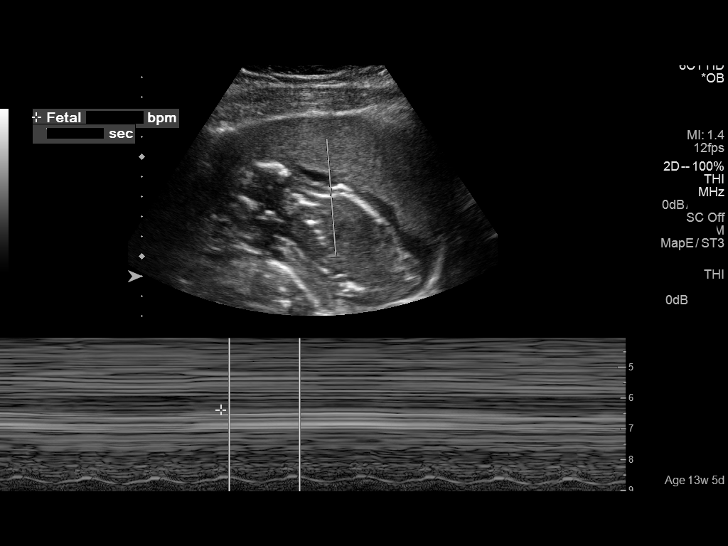
[im 6/21]
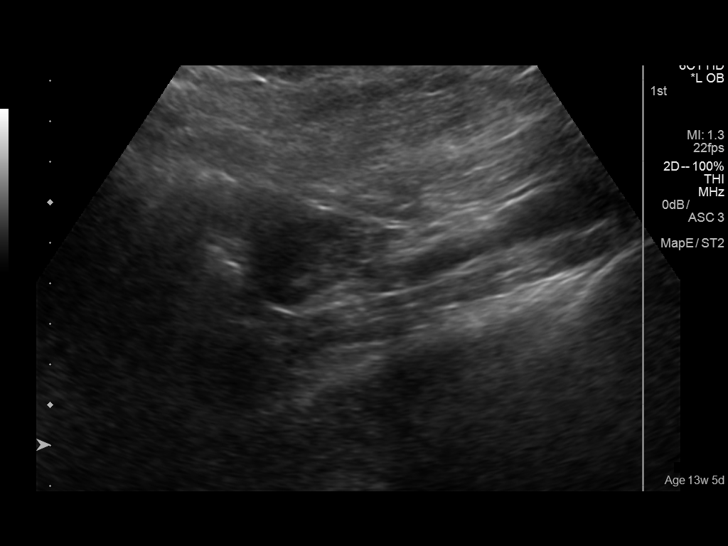
[im 7/21]
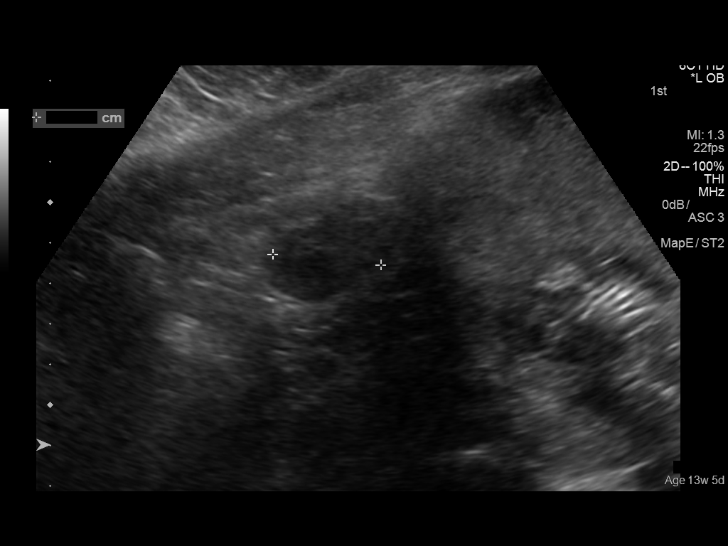
[im 9/21]
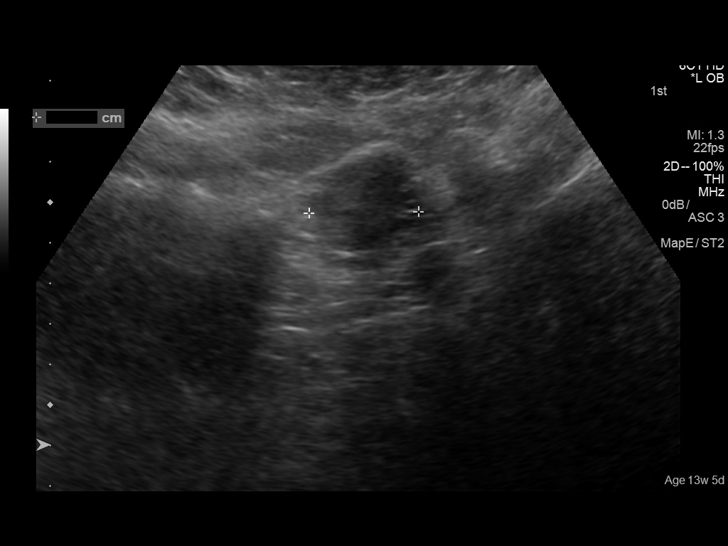
[im 10/21]
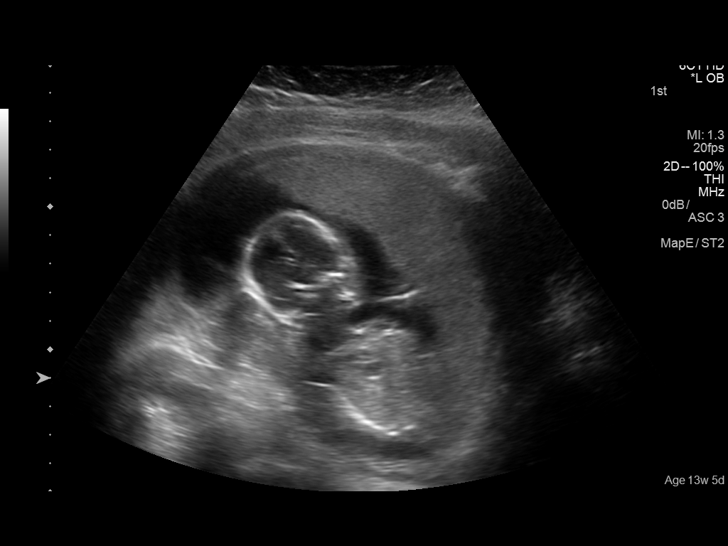
[im 12/21]
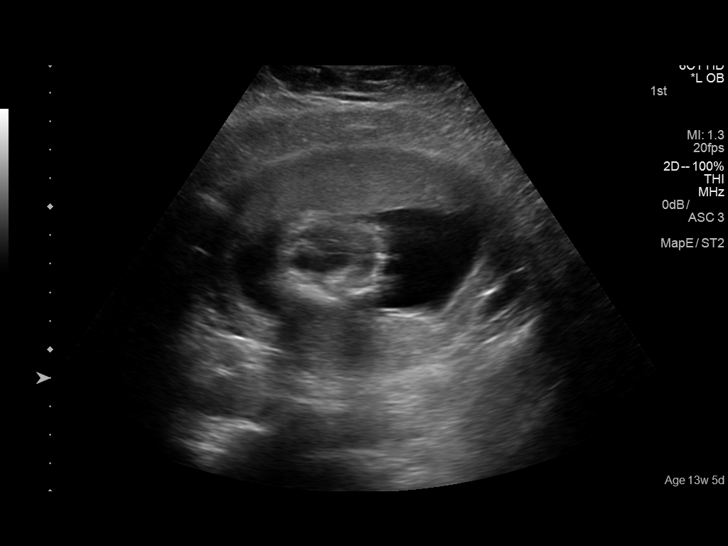
[im 13/21]
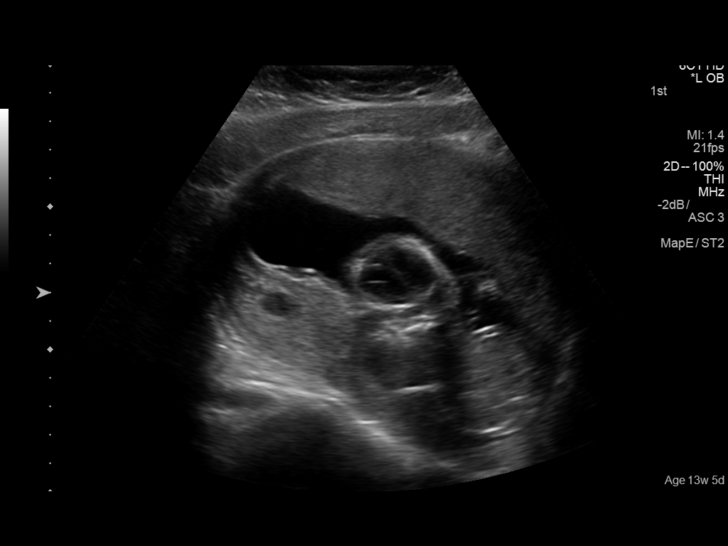
[im 15/21]
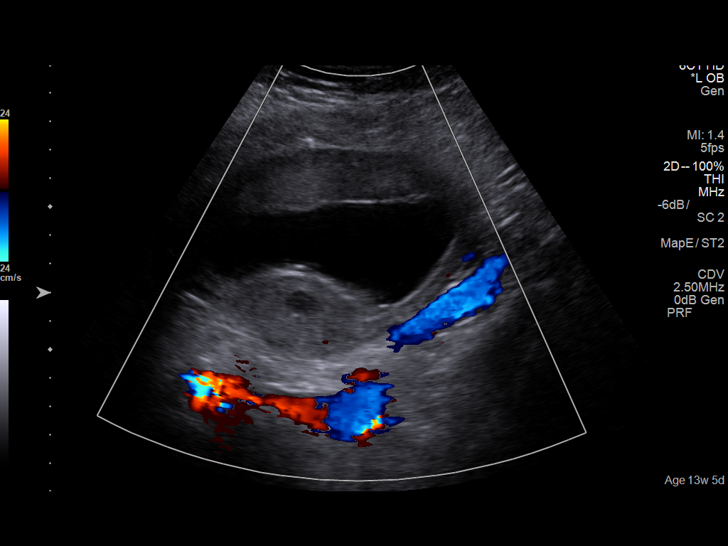
[im 16/21]
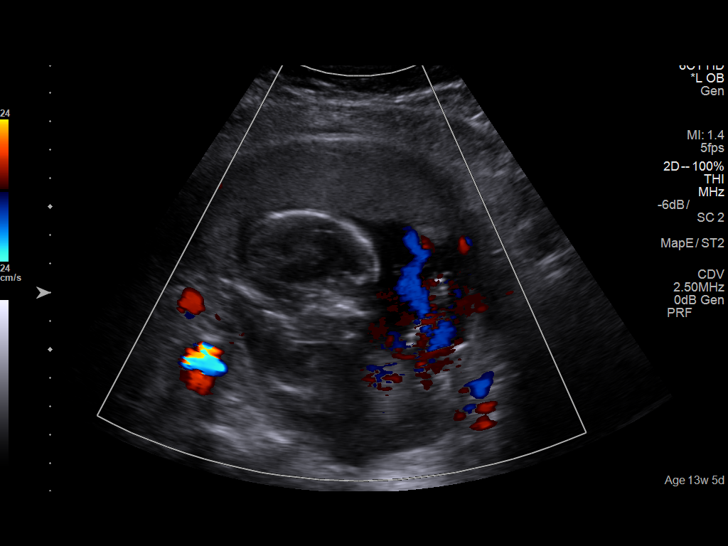
[im 18/21]
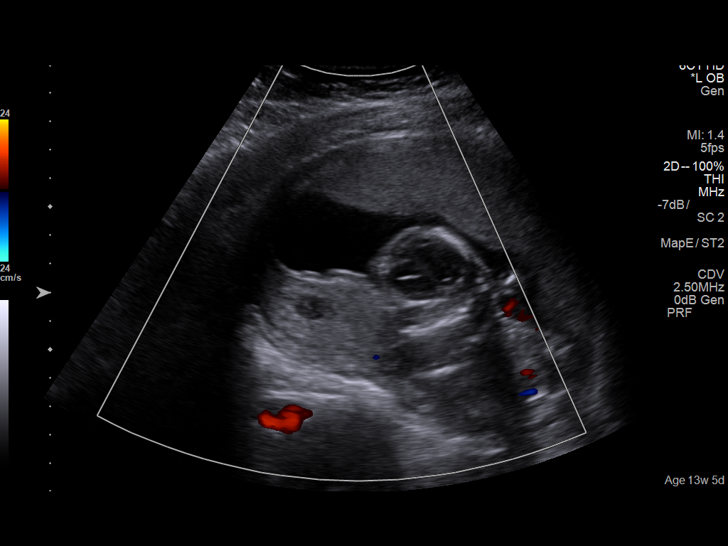
[im 19/21]
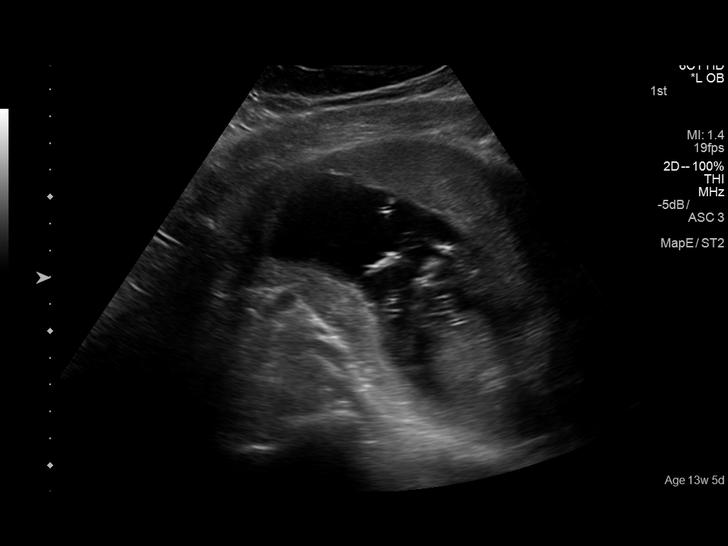
[im 21/21]
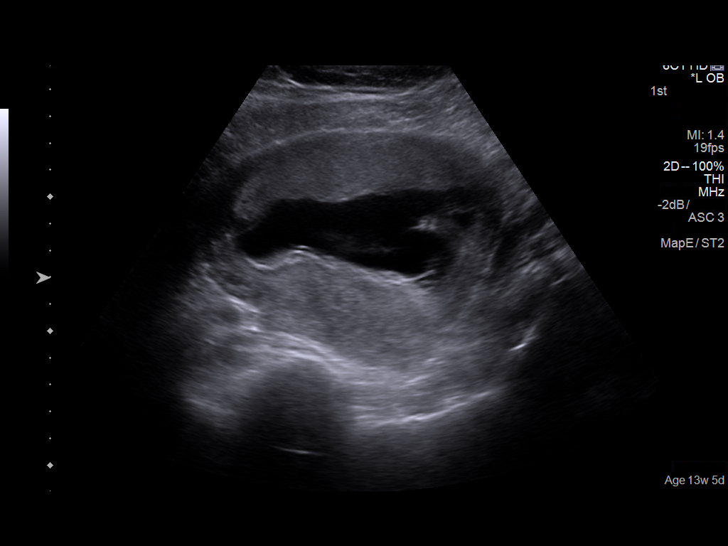

[14 of 21 positions shown; findings below may reference images not displayed]

FINDINGS: Number of Fetuses: 1

Heart Rate:  145 bpm

Movement: Yes

Presentation: Breech

Placental Location: Anterior

Previa: No

Amniotic Fluid (Subjective):  Normal

BPD:  3.5cm 16w  5d

MATERNAL FINDINGS:

Cervix:  Appears closed.

Uterus/Adnexae: No ovarian or adnexal mass. Note is made of a
probable posterior 1 cm uterine fibroid.
IMPRESSION: Intrauterine pregnancy of approximately 16 weeks 5 days with fetal
heart rate of 145 beats per minute.

This exam is performed on an emergent basis and does not
comprehensively evaluate fetal size, dating, or anatomy; follow-up
complete OB US should be considered if further fetal assessment is
warranted.

## 2016-01-19 ENCOUNTER — Encounter: Payer: Self-pay | Admitting: Family Medicine

## 2016-01-28 ENCOUNTER — Inpatient Hospital Stay (HOSPITAL_COMMUNITY)
Admission: AD | Admit: 2016-01-28 | Discharge: 2016-01-28 | Disposition: A | Discharge status: Home / Self Care | Payer: Medicaid Other | Source: Ambulatory Visit | Attending: Obstetrics & Gynecology | Admitting: Obstetrics & Gynecology

## 2016-01-28 ENCOUNTER — Encounter (HOSPITAL_COMMUNITY): Payer: Self-pay | Admitting: *Deleted

## 2016-01-28 DIAGNOSIS — Z3A36 36 weeks gestation of pregnancy: Secondary | ICD-10-CM | POA: Insufficient documentation

## 2016-01-28 DIAGNOSIS — O26893 Other specified pregnancy related conditions, third trimester: Secondary | ICD-10-CM | POA: Insufficient documentation

## 2016-01-28 DIAGNOSIS — O1493 Unspecified pre-eclampsia, third trimester: Secondary | ICD-10-CM

## 2016-01-28 DIAGNOSIS — O2603 Excessive weight gain in pregnancy, third trimester: Secondary | ICD-10-CM | POA: Diagnosis not present

## 2016-01-28 DIAGNOSIS — O2602 Excessive weight gain in pregnancy, second trimester: Secondary | ICD-10-CM

## 2016-01-28 DIAGNOSIS — R030 Elevated blood-pressure reading, without diagnosis of hypertension: Secondary | ICD-10-CM | POA: Insufficient documentation

## 2016-01-28 DIAGNOSIS — O283 Abnormal ultrasonic finding on antenatal screening of mother: Secondary | ICD-10-CM

## 2016-01-28 DIAGNOSIS — Z3402 Encounter for supervision of normal first pregnancy, second trimester: Secondary | ICD-10-CM

## 2016-01-28 LAB — URINALYSIS, ROUTINE W REFLEX MICROSCOPIC
BILIRUBIN URINE: NEGATIVE
GLUCOSE, UA: NEGATIVE mg/dL
Ketones, ur: NEGATIVE mg/dL
NITRITE: NEGATIVE
PH: 6 (ref 5.0–8.0)
Protein, ur: 30 mg/dL — AB
SPECIFIC GRAVITY, URINE: 1.01 (ref 1.005–1.030)

## 2016-01-28 LAB — COMPREHENSIVE METABOLIC PANEL
ALK PHOS: 143 U/L — AB (ref 38–126)
ALT: 14 U/L (ref 14–54)
AST: 20 U/L (ref 15–41)
Albumin: 2.9 g/dL — ABNORMAL LOW (ref 3.5–5.0)
Anion gap: 7 (ref 5–15)
BUN: 10 mg/dL (ref 6–20)
CALCIUM: 8.8 mg/dL — AB (ref 8.9–10.3)
CO2: 24 mmol/L (ref 22–32)
Chloride: 106 mmol/L (ref 101–111)
Creatinine, Ser: 0.76 mg/dL (ref 0.44–1.00)
Glucose, Bld: 81 mg/dL (ref 65–99)
Potassium: 4.5 mmol/L (ref 3.5–5.1)
Sodium: 137 mmol/L (ref 135–145)
TOTAL PROTEIN: 6.4 g/dL — AB (ref 6.5–8.1)
Total Bilirubin: 0.5 mg/dL (ref 0.3–1.2)

## 2016-01-28 LAB — PROTEIN / CREATININE RATIO, URINE
CREATININE, URINE: 132 mg/dL
PROTEIN CREATININE RATIO: 0.63 mg/mg{creat} — AB (ref 0.00–0.15)
TOTAL PROTEIN, URINE: 83 mg/dL

## 2016-01-28 LAB — CBC
HCT: 30.4 % — ABNORMAL LOW (ref 36.0–46.0)
HEMOGLOBIN: 9.9 g/dL — AB (ref 12.0–15.0)
MCH: 26.4 pg (ref 26.0–34.0)
MCHC: 32.6 g/dL (ref 30.0–36.0)
MCV: 81.1 fL (ref 78.0–100.0)
Platelets: 200 10*3/uL (ref 150–400)
RBC: 3.75 MIL/uL — AB (ref 3.87–5.11)
RDW: 13.9 % (ref 11.5–15.5)
WBC: 8.8 10*3/uL (ref 4.0–10.5)

## 2016-01-28 LAB — URINE MICROSCOPIC-ADD ON

## 2016-01-28 LAB — OB RESULTS CONSOLE GBS: STREP GROUP B AG: NEGATIVE

## 2016-01-28 MED ORDER — ACETAMINOPHEN 325 MG PO TABS: 650.0000 mg | ORAL_TABLET | Freq: Once | ORAL | Status: DC

## 2016-01-28 NOTE — Discharge Instructions (Signed)

## 2016-01-28 NOTE — MAU Note (Signed)
Sent from OB's office for PIH eval;  

## 2016-01-28 NOTE — MAU Provider Note (Signed)
MAU HISTORY AND PHYSICAL  Chief Complaint:  Hypertension  Cindy [name] is a 25 y.o.  G1P0000 with IUP at [redacted]w[redacted]d presenting for Hypertension. Checked her BP at local pharmacy about 12 pm this afternoon which was 158/99. Then, she called her nurse at [facility] who advised her to come to MAU to be checked. She reports mild frontal headache, mild lightheadedness. Denies vision changes or belly pain. She has mild bilateral edema.  She denies contraction, vaginal bleeding, fluid leak or gush. Fetal movement is present and at baseline.   Past Medical History  Diagnosis Date  . Pregnancy   . Medical history non-contributory     Past Surgical History  Procedure Laterality Date  . Tongue surgery      when pt was 25 years old    Family History  Problem Relation Age of Onset  . Arthritis Maternal Grandmother   . Hypertension Maternal Grandmother   . Diabetes Maternal Grandfather   . Hypertension Maternal Grandfather     Social History  Substance Use Topics  . Smoking status: Never Smoker   . Smokeless tobacco: None  . Alcohol Use: No     Comment: occ    Allergies  Allergen Reactions  . Latex Itching and Other (See Comments)    Causes redness.  . Pineapple Swelling    Swelling in of the throat.  . Nickel Rash    Prescriptions prior to admission  Medication Sig Dispense Refill Last Dose  . Prenatal Vit-Fe Fumarate-FA (MULTIVITAMIN-PRENATAL) 27-0.8 MG TABS tablet Take 1 tablet by mouth daily at 12 noon.   01/27/2016 at Unknown time  . ranitidine (ZANTAC) 150 MG tablet Take 1 tablet (150 mg total) by mouth 2 (two) times daily. 60 tablet 3 01/28/2016 at Unknown time   Review of Systems  Constitutional: Negative for fever and chills.  Eyes: Negative for blurred vision and double vision.  Respiratory: Negative for cough and shortness of breath.   Cardiovascular: Negative for chest pain.  Gastrointestinal: Negative for nausea and vomiting.  Genitourinary: Negative for dysuria.   Neurological: Positive for dizziness and headaches. Negative for tingling.    Physical Exam  Blood pressure 145/97, pulse 90, last menstrual period 06/12/2015. GENERAL: obese, well-nourished female in no acute distress.  LUNGS: Clear to auscultation bilaterally.  HEART: Regular rate and rhythm. ABDOMEN: Soft, nontender, nondistended, gravid.  EXTREMITIES: Nontender, 1+ pitting edema, 2+ distal pulses. Cervical Exam: none done Presentation: not done FHT:  130's/reactive/no decels Contractions: none noted on toco   Labs: Results for orders placed or performed during the hospital encounter of 01/28/16 (from the past 24 hour(s))  Urinalysis, Routine w reflex microscopic (not at [facility])   Collection Time: 01/28/16  2:15 PM  Result Value Ref Range   Color, Urine YELLOW YELLOW   APPearance CLOUDY (A) CLEAR   Specific Gravity, Urine 1.010 1.005 - 1.030   pH 6.0 5.0 - 8.0   Glucose, UA NEGATIVE NEGATIVE mg/dL   Hgb urine dipstick MODERATE (A) NEGATIVE   Bilirubin Urine NEGATIVE NEGATIVE   Ketones, ur NEGATIVE NEGATIVE mg/dL   Protein, ur 30 (A) NEGATIVE mg/dL   Nitrite NEGATIVE NEGATIVE   Leukocytes, UA SMALL (A) NEGATIVE  Protein / creatinine ratio, urine   Collection Time: 01/28/16  2:15 PM  Result Value Ref Range   Creatinine, Urine 132.00 mg/dL   Total Protein, Urine 83 mg/dL   Protein Creatinine Ratio 0.63 (H) 0.00 - 0.15 mg/mg[Cre]  Urine microscopic-add on   Collection Time: 01/28/16  2:15 PM  Result Value Ref Range   Squamous Epithelial / LPF 6-30 (A) NONE SEEN   WBC, UA 6-30 0 - 5 WBC/hpf   RBC / HPF 0-5 0 - 5 RBC/hpf   Bacteria, UA FEW (A) NONE SEEN  CBC   Collection Time: 01/28/16  3:18 PM  Result Value Ref Range   WBC 8.8 4.0 - 10.5 K/uL   RBC 3.75 (L) 3.87 - 5.11 MIL/uL   Hemoglobin 9.9 (L) 12.0 - 15.0 g/dL   HCT 16.130.4 (L) 09.636.0 - 04.546.0 %   MCV 81.1 78.0 - 100.0 fL   MCH 26.4 26.0 - 34.0 pg   MCHC 32.6 30.0 - 36.0 g/dL   RDW 40.913.9 81.111.5 - 91.415.5 %   Platelets 200  150 - 400 K/uL  Comprehensive metabolic panel   Collection Time: 01/28/16  3:18 PM  Result Value Ref Range   Sodium 137 135 - 145 mmol/L   Potassium 4.5 3.5 - 5.1 mmol/L   Chloride 106 101 - 111 mmol/L   CO2 24 22 - 32 mmol/L   Glucose, Bld 81 65 - 99 mg/dL   BUN 10 6 - 20 mg/dL   Creatinine, Ser 7.820.76 0.44 - 1.00 mg/dL   Calcium 8.8 (L) 8.9 - 10.3 mg/dL   Total Protein 6.4 (L) 6.5 - 8.1 g/dL   Albumin 2.9 (L) 3.5 - 5.0 g/dL   AST 20 15 - 41 U/L   ALT 14 14 - 54 U/L   Alkaline Phosphatase 143 (H) 38 - 126 U/L   Total Bilirubin 0.5 0.3 - 1.2 mg/dL   GFR calc non Af Amer >60 >60 mL/min   GFR calc Af Amer >60 >60 mL/min   Anion gap 7 5 - 15    Imaging Studies:  [place] [facility] Ob Follow Up  01/14/2016  OBSTETRICAL ULTRASOUND: This exam was performed within a [facility]. The OB US report was generated in the AS system, and faxed to the ordering physician.  This report is available in the [employer] PACS. See the AS Obstetric US report via the Image Link.   Assessment: Cindy [name] is  25 y.o. G1P0000 at 4450w3d by 16w US here with mild range BP. UPC elevated to 0.63. She technically meet criteria for Preeclampsia. She has no severe features.  FWB-CAT-1.  Plan: [facility]. GBS culture collected Discussed return precautions and IOL Scheduled has been set for 02/02/2016 at 7:30 am  Almon Herculesaye T Gonfa 5/18/20174:01 PM   OB fellow attestation: I have seen and examined this patient; I agree with above documentation in the resident's note.   Cindy [name] is a 25 y.o. G1P0000 reporting elevated BP. Denies HA, RUQ pain, SOB, change in edema.  +FM, denies LOF, VB, contractions, vaginal discharge.  PE: BP 162/84 mmHg  Pulse 89  Resp 18  LMP 06/12/2015 (Approximate) Gen: calm comfortable, NAD Resp: normal effort, no distress Abd: gravid  ROS, labs, PMH reviewed NST reactive   Plan: - IOL scheduled 5/23 - Plan for follow  up in clinic for BP monitoring and NST - reviewed preeclampsia warning s/sx - fetal kick counts reinforced,  labor precautions - continue routine follow up in OB clinic  Cindy FlakeKimberly Niles Akosua Constantine, MD , MPH, ABFM Family Medicine, OB Fellow [facility]

## 2016-01-29 ENCOUNTER — Encounter (HOSPITAL_COMMUNITY): Payer: Self-pay | Admitting: *Deleted

## 2016-01-29 ENCOUNTER — Telehealth (HOSPITAL_COMMUNITY): Payer: Self-pay | Admitting: *Deleted

## 2016-01-29 ENCOUNTER — Ambulatory Visit: Payer: Medicaid Other

## 2016-01-29 VITALS — BP 141/89 | Wt 292.0 lb

## 2016-01-29 DIAGNOSIS — Z0130 Encounter for examination of blood pressure without abnormal findings: Secondary | ICD-10-CM

## 2016-01-29 NOTE — Progress Notes (Signed)
Pt presented to clinic today for a blood pressure check. BP is 141/89. Patient has been schedule to be induced on 02/02/2016 per chart review.

## 2016-01-29 NOTE — Telephone Encounter (Signed)
Preadmission screen  

## 2016-01-30 ENCOUNTER — Encounter (HOSPITAL_COMMUNITY): Payer: Self-pay

## 2016-01-30 ENCOUNTER — Inpatient Hospital Stay (HOSPITAL_COMMUNITY)
Admission: AD | Admit: 2016-01-30 | Discharge: 2016-02-02 | DRG: 775 | Disposition: A | Discharge status: Home / Self Care | Payer: Medicaid Other | Source: Ambulatory Visit | Attending: Obstetrics and Gynecology | Admitting: Obstetrics and Gynecology

## 2016-01-30 DIAGNOSIS — Z833 Family history of diabetes mellitus: Secondary | ICD-10-CM

## 2016-01-30 DIAGNOSIS — O2603 Excessive weight gain in pregnancy, third trimester: Secondary | ICD-10-CM | POA: Diagnosis present

## 2016-01-30 DIAGNOSIS — O1490 Unspecified pre-eclampsia, unspecified trimester: Secondary | ICD-10-CM | POA: Diagnosis present

## 2016-01-30 DIAGNOSIS — O1414 Severe pre-eclampsia complicating childbirth: Principal | ICD-10-CM | POA: Diagnosis present

## 2016-01-30 DIAGNOSIS — Z3402 Encounter for supervision of normal first pregnancy, second trimester: Secondary | ICD-10-CM

## 2016-01-30 DIAGNOSIS — O1494 Unspecified pre-eclampsia, complicating childbirth: Secondary | ICD-10-CM | POA: Diagnosis not present

## 2016-01-30 DIAGNOSIS — O283 Abnormal ultrasonic finding on antenatal screening of mother: Secondary | ICD-10-CM | POA: Diagnosis present

## 2016-01-30 DIAGNOSIS — O1204 Gestational edema, complicating childbirth: Secondary | ICD-10-CM | POA: Diagnosis present

## 2016-01-30 DIAGNOSIS — Z8249 Family history of ischemic heart disease and other diseases of the circulatory system: Secondary | ICD-10-CM

## 2016-01-30 DIAGNOSIS — Z3A36 36 weeks gestation of pregnancy: Secondary | ICD-10-CM

## 2016-01-30 DIAGNOSIS — O2602 Excessive weight gain in pregnancy, second trimester: Secondary | ICD-10-CM

## 2016-01-30 LAB — COMPREHENSIVE METABOLIC PANEL
ALT: 12 U/L — ABNORMAL LOW (ref 14–54)
ANION GAP: 9 (ref 5–15)
AST: 19 U/L (ref 15–41)
Albumin: 3 g/dL — ABNORMAL LOW (ref 3.5–5.0)
Alkaline Phosphatase: 164 U/L — ABNORMAL HIGH (ref 38–126)
BILIRUBIN TOTAL: 0.5 mg/dL (ref 0.3–1.2)
BUN: 8 mg/dL (ref 6–20)
CO2: 23 mmol/L (ref 22–32)
CREATININE: 0.74 mg/dL (ref 0.44–1.00)
Calcium: 8.8 mg/dL — ABNORMAL LOW (ref 8.9–10.3)
Chloride: 104 mmol/L (ref 101–111)
GFR calc Af Amer: >60 mL/min (ref >60)
Glucose, Bld: 72 mg/dL (ref 65–99)
POTASSIUM: 4.3 mmol/L (ref 3.5–5.1)
Sodium: 136 mmol/L (ref 135–145)
Total Protein: 6.4 g/dL — ABNORMAL LOW (ref 6.5–8.1)

## 2016-01-30 LAB — CBC
HCT: 32.5 % — ABNORMAL LOW (ref 36.0–46.0)
HEMOGLOBIN: 10.7 g/dL — AB (ref 12.0–15.0)
MCH: 26.7 pg (ref 26.0–34.0)
MCHC: 32.9 g/dL (ref 30.0–36.0)
MCV: 81 fL (ref 78.0–100.0)
Platelets: 228 10*3/uL (ref 150–400)
RBC: 4.01 MIL/uL (ref 3.87–5.11)
RDW: 14.1 % (ref 11.5–15.5)
WBC: 8.9 10*3/uL (ref 4.0–10.5)

## 2016-01-30 LAB — URINE MICROSCOPIC-ADD ON

## 2016-01-30 LAB — PROTEIN / CREATININE RATIO, URINE
Creatinine, Urine: 131 mg/dL
Protein Creatinine Ratio: 1.61 mg/mg{Cre} — ABNORMAL HIGH (ref 0.00–0.15)
Total Protein, Urine: 211 mg/dL

## 2016-01-30 LAB — URINALYSIS, ROUTINE W REFLEX MICROSCOPIC
BILIRUBIN URINE: NEGATIVE
Glucose, UA: NEGATIVE mg/dL
KETONES UR: NEGATIVE mg/dL
LEUKOCYTES UA: NEGATIVE
NITRITE: NEGATIVE
PH: 7 (ref 5.0–8.0)
Protein, ur: 100 mg/dL — AB
Specific Gravity, Urine: 1.015 (ref 1.005–1.030)

## 2016-01-30 MED ORDER — OXYTOCIN 40 UNITS IN LACTATED RINGERS INFUSION - SIMPLE MED: 2.5000 [IU]/h | INTRAVENOUS | Status: DC

## 2016-01-30 MED ORDER — BETAMETHASONE SOD PHOS & ACET 6 (3-3) MG/ML IJ SUSP
12.0000 mg | Freq: Once | INTRAMUSCULAR | Status: AC
  Administered 2016-01-31: 12 mg via INTRAMUSCULAR
  Filled 2016-01-30: qty 2

## 2016-01-30 MED ORDER — LACTATED RINGERS IV SOLN
INTRAVENOUS | Status: DC
  Administered 2016-01-30 – 2016-02-01 (×4): via INTRAVENOUS

## 2016-01-30 MED ORDER — ACETAMINOPHEN 500 MG PO TABS
1000.0000 mg | ORAL_TABLET | Freq: Once | ORAL | Status: AC
  Administered 2016-01-30: 1000 mg via ORAL
  Filled 2016-01-30: qty 2

## 2016-01-30 MED ORDER — LABETALOL HCL 5 MG/ML IV SOLN: 20.0000 mg | Freq: Once | INTRAVENOUS | Status: DC

## 2016-01-30 MED ORDER — OXYCODONE-ACETAMINOPHEN 5-325 MG PO TABS: 1.0000 | ORAL_TABLET | ORAL | Status: DC | PRN

## 2016-01-30 MED ORDER — ONDANSETRON HCL 4 MG/2ML IJ SOLN
4.0000 mg | Freq: Four times a day (QID) | INTRAMUSCULAR | Status: DC | PRN
  Administered 2016-01-31 (×2): 4 mg via INTRAVENOUS
  Filled 2016-01-30 (×2): qty 2

## 2016-01-30 MED ORDER — ACETAMINOPHEN 325 MG PO TABS
650.0000 mg | ORAL_TABLET | ORAL | Status: DC | PRN
  Administered 2016-01-31 (×2): 650 mg via ORAL
  Filled 2016-01-30 (×2): qty 2

## 2016-01-30 MED ORDER — LIDOCAINE HCL (PF) 1 % IJ SOLN
30.0000 mL | INTRAMUSCULAR | Status: DC | PRN
  Filled 2016-01-30: qty 30

## 2016-01-30 MED ORDER — LACTATED RINGERS IV SOLN: 500.0000 mL | INTRAVENOUS | Status: DC | PRN

## 2016-01-30 MED ORDER — MAGNESIUM SULFATE BOLUS VIA INFUSION
6.0000 g | Freq: Once | INTRAVENOUS | Status: AC
  Administered 2016-01-31: 6 g via INTRAVENOUS
  Filled 2016-01-30: qty 500

## 2016-01-30 MED ORDER — OXYCODONE-ACETAMINOPHEN 5-325 MG PO TABS: 2.0000 | ORAL_TABLET | ORAL | Status: DC | PRN

## 2016-01-30 MED ORDER — CITRIC ACID-SODIUM CITRATE 334-500 MG/5ML PO SOLN
30.0000 mL | ORAL | Status: DC | PRN
  Administered 2016-01-31 (×2): 30 mL via ORAL
  Filled 2016-01-30 (×2): qty 15

## 2016-01-30 MED ORDER — OXYTOCIN BOLUS FROM INFUSION: 500.0000 mL | INTRAVENOUS | Status: DC

## 2016-01-30 MED ORDER — MAGNESIUM SULFATE 50 % IJ SOLN
2.0000 g/h | INTRAVENOUS | Status: AC
  Administered 2016-01-31 (×2): 2 g/h via INTRAVENOUS
  Filled 2016-01-30 (×3): qty 80

## 2016-01-30 NOTE — H&P (Signed)
LABOR ADMISSION HISTORY AND PHYSICAL  Cindy [name] is a 25 y.o. female G1P0000 with IUP at [redacted]w[redacted]d by 16wk [place] presenting for new onset lower extremity edema, visual floaters, and headache. States she was diagnosed with pre-eclampsia two days prior to presentation. States she has had edema for a while, but it acutely worsened last night and is now painful. Headache present since this morning; frontal and unrelieved by Tylenol. Had floaters this morning bilaterally, but this has resolved.. She reports +FMs, No LOF.  She plans on Breast feeding. Birth control method is undecided.   Dating: By 16wk [place] --->  Estimated Date of Delivery: 02/22/16  , CWD, normal anatomy, cephalic presentation, 70% EFW   Prenatal History/Complications:  Past Medical History: Past Medical History  Diagnosis Date  . Pregnancy   . Medical history non-contributory   . Hypertension     Past Surgical History: Past Surgical History  Procedure Laterality Date  . Tongue surgery      when pt was 25 years old    Obstetrical History: OB History    Gravida Para Term Preterm AB TAB SAB Ectopic Multiple Living        Social History: Social History   Social History  . Marital Status: Single    Spouse Name: N/A  . Number of Children: N/A  . Years of Education: N/A   Social History Main Topics  . Smoking status: Never Smoker   . Smokeless tobacco: Never Used  . Alcohol Use: No     Comment: occ  . Drug Use: No  . Sexual Activity: Yes    Birth Control/ Protection: None, Other-see comments   Other Topics Concern  . None   Social History Narrative    Family History: Family History  Problem Relation Age of Onset  . Arthritis Maternal Grandmother   . Hypertension Maternal Grandmother   . Diabetes Maternal Grandfather   . Hypertension Maternal Grandfather     Allergies: Allergies  Allergen Reactions  . Latex Itching and Other (See Comments)    Causes redness.  . Pineapple  Swelling    Swelling in of the throat.  . Nickel Rash    Prescriptions prior to admission  Medication Sig Dispense Refill Last Dose  . acetaminophen (TYLENOL) 325 MG tablet Take 650 mg by mouth every 6 (six) hours as needed.   01/30/2016 at Unknown time  . Prenatal Vit-Fe Fumarate-FA (MULTIVITAMIN-PRENATAL) 27-0.8 MG TABS tablet Take 1 tablet by mouth daily at 12 noon.   01/30/2016 at Unknown time  . ranitidine (ZANTAC) 150 MG tablet Take 1 tablet (150 mg total) by mouth 2 (two) times daily. 60 tablet 3 01/30/2016 at Unknown time     Review of Systems   All systems reviewed and negative except as stated in HPI  Blood pressure 137/88, pulse 75, temperature 98.4 F (36.9 C), temperature source Oral, resp. rate 18, height  (1.727 m), weight 133.358 kg (294 lb), last menstrual period 06/12/2015, SpO2 100 %. General appearance: alert, cooperative and no distress Lungs: clear to auscultation bilaterally Heart: regular rate and rhythm Abdomen: soft, non-tender; bowel sounds normal Extremities: Homans sign is negative, no sign of DVT Presentation: cephalic Fetal monitoringBaseline: 130 bpm, Variability: Good {> 6 bpm) and Accelerations: Reactive Uterine activity Irregular  Dilation: 2 Effacement (%): 50 Station: -3 Exam by:: Dr. Caroleen Hamman   Prenatal labs: ABO, Rh: B/POS/-- (02/09 1446) Antibody: NEG (02/09 1446) Rubella: !Error! 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 up)   Results for orders placed or performed during the hospital encounter of 01/30/16 (from the past 24 hour(s))  Urinalysis, Routine w reflex microscopic (not at [facility])   Collection Time: 01/30/16  8:06 PM  Result Value Ref Range   Color, Urine YELLOW YELLOW   APPearance CLEAR CLEAR   Specific Gravity, Urine 1.015  1.005 - 1.030   pH 7.0 5.0 - 8.0   Glucose, UA NEGATIVE NEGATIVE mg/dL   Hgb urine dipstick SMALL (A) NEGATIVE   Bilirubin Urine NEGATIVE NEGATIVE   Ketones, ur NEGATIVE NEGATIVE mg/dL   Protein, ur 161 (A) NEGATIVE mg/dL   Nitrite NEGATIVE NEGATIVE   Leukocytes, UA NEGATIVE NEGATIVE  Protein / creatinine ratio, urine   Collection Time: 01/30/16  8:06 PM  Result Value Ref Range   Creatinine, Urine 131.00 mg/dL   Total Protein, Urine 211 mg/dL   Protein Creatinine Ratio 1.61 (H) 0.00 - 0.15 mg/mg[Cre]  Urine microscopic-add on   Collection Time: 01/30/16  8:06 PM  Result Value Ref Range   Squamous Epithelial / LPF 0-5 (A) NONE SEEN   WBC, UA 0-5 0 - 5 WBC/hpf   RBC / HPF 0-5 0 - 5 RBC/hpf   Bacteria, UA FEW (A) NONE SEEN  CBC   Collection Time: 01/30/16  8:50 PM  Result Value Ref Range   WBC 8.9 4.0 - 10.5 K/uL   RBC 4.01 3.87 - 5.11 MIL/uL   Hemoglobin 10.7 (L) 12.0 - 15.0 g/dL   HCT 09.6 (L) 04.5 - 40.9 %   MCV 81.0 78.0 - 100.0 fL   MCH 26.7 26.0 - 34.0 pg   MCHC 32.9 30.0 - 36.0 g/dL   RDW 81.1 91.4 - 78.2 %   Platelets 228 150 - 400 K/uL  Comprehensive metabolic panel   Collection Time: 01/30/16  8:50 PM  Result Value Ref Range   Sodium 136 135 - 145 mmol/L   Potassium 4.3 3.5 - 5.1 mmol/L   Chloride 104 101 - 111 mmol/L   CO2 23 22 - 32 mmol/L   Glucose, Bld 72 65 - 99 mg/dL   BUN 8 6 - 20 mg/dL   Creatinine, Ser 9.56 0.44 - 1.00 mg/dL   Calcium 8.8 (L) 8.9 - 10.3 mg/dL   Total Protein 6.4 (L) 6.5 - 8.1 g/dL   Albumin 3.0 (L) 3.5 - 5.0 g/dL   AST 19 15 - 41 U/L   ALT 12 (L) 14 - 54 U/L   Alkaline Phosphatase 164 (H) 38 - 126 U/L   Total Bilirubin 0.5 0.3 - 1.2 mg/dL   GFR calc non Af Amer >60 >60 mL/min   GFR calc Af Amer >60 >60 mL/min   Anion gap 9 5 - 15    Patient Active Problem List   Diagnosis Date Noted  . Preeclampsia 01/30/2016  . Abnormal fetal ultrasound 11/17/2015  . Supervision of normal first pregnancy in second trimester 10/22/2015  .  Excessive weight gain during pregnancy in second trimester 10/22/2015    Assessment: [name] is a 25 y.o. G1P0000 at [redacted]w[redacted]d here for induction of labor for pre-eclampsia.  #Pre-Eclampsia: Symptomatic with headache and edema. Protein-Creatinine Ratio of 1.61. Monitor BP--stable at this time with last reading 137/88. Antihypertensive not indicated. Will give loading dose  of Magnesium 6g and IV infusion of 2g. Betamethasone now and then repeat in 12hr.  #Labor: Induction of labor for pre-eclampsia. Plan for Foley Bulb placement and Cytotec. #Pain: Oral pain medication #FWB: Category I #ID:  GBS pending #MOF: Breast #MOC:Undecided  [facility] CenterRaleigh Rumley 01/30/2016, 11:57 PM   OB FELLOW HISTORY AND PHYSICAL ATTESTATION  I have seen and examined this patient; I agree with above documentation in the resident's note.    Cherrie Gauzeoah B Jerris Keltz 01/31/2016, 1:07 AM

## 2016-01-30 NOTE — MAU Note (Signed)
Pt c/o feeling dizzy throughout the day. States she has vomited 3 times today. Says that she took her medication and that it usually helps but has not helped today. States that the swelling in her ankles is much worse and hurts, despite propping feet up. Denies contractions, vag bleeding or LOF. +FM. States that she has a HA now. Saw some floaters earlier but none now. Is an IOL for HBP for 5/23

## 2016-01-31 ENCOUNTER — Encounter (HOSPITAL_COMMUNITY): Payer: Self-pay | Admitting: Anesthesiology

## 2016-01-31 ENCOUNTER — Encounter (HOSPITAL_COMMUNITY): Payer: Self-pay | Admitting: *Deleted

## 2016-01-31 DIAGNOSIS — O2603 Excessive weight gain in pregnancy, third trimester: Secondary | ICD-10-CM

## 2016-01-31 DIAGNOSIS — O1494 Unspecified pre-eclampsia, complicating childbirth: Secondary | ICD-10-CM

## 2016-01-31 DIAGNOSIS — Z3A36 36 weeks gestation of pregnancy: Secondary | ICD-10-CM

## 2016-01-31 LAB — TYPE AND SCREEN
ABO/RH(D): B POS
ANTIBODY SCREEN: NEGATIVE

## 2016-01-31 LAB — CULTURE, BETA STREP (GROUP B ONLY)

## 2016-01-31 LAB — ABO/RH: ABO/RH(D): B POS

## 2016-01-31 LAB — RPR: RPR: NONREACTIVE

## 2016-01-31 MED ORDER — PHENYLEPHRINE 40 MCG/ML (10ML) SYRINGE FOR IV PUSH (FOR BLOOD PRESSURE SUPPORT)
PREFILLED_SYRINGE | INTRAVENOUS | Status: AC
  Filled 2016-01-31: qty 20

## 2016-01-31 MED ORDER — DIPHENHYDRAMINE HCL 25 MG PO CAPS: 25.0000 mg | ORAL_CAPSULE | Freq: Four times a day (QID) | ORAL | Status: DC | PRN

## 2016-01-31 MED ORDER — SODIUM CHLORIDE 0.9% FLUSH: 3.0000 mL | Freq: Two times a day (BID) | INTRAVENOUS | Status: DC

## 2016-01-31 MED ORDER — TETANUS-DIPHTH-ACELL PERTUSSIS 5-2.5-18.5 LF-MCG/0.5 IM SUSP: 0.5000 mL | Freq: Once | INTRAMUSCULAR | Status: DC

## 2016-01-31 MED ORDER — SODIUM CHLORIDE 0.9 % IV SOLN: 250.0000 mL | INTRAVENOUS | Status: DC | PRN

## 2016-01-31 MED ORDER — PHENYLEPHRINE 40 MCG/ML (10ML) SYRINGE FOR IV PUSH (FOR BLOOD PRESSURE SUPPORT)
80.0000 ug | PREFILLED_SYRINGE | INTRAVENOUS | Status: DC | PRN
  Filled 2016-01-31: qty 5

## 2016-01-31 MED ORDER — DIPHENHYDRAMINE HCL 50 MG/ML IJ SOLN: 12.5000 mg | INTRAMUSCULAR | Status: DC | PRN

## 2016-01-31 MED ORDER — ACETAMINOPHEN 325 MG PO TABS: 650.0000 mg | ORAL_TABLET | ORAL | Status: DC | PRN

## 2016-01-31 MED ORDER — EPHEDRINE 5 MG/ML INJ
10.0000 mg | INTRAVENOUS | Status: DC | PRN
  Filled 2016-01-31: qty 2

## 2016-01-31 MED ORDER — BENZOCAINE-MENTHOL 20-0.5 % EX AERO
1.0000 "application " | INHALATION_SPRAY | CUTANEOUS | Status: DC | PRN
  Filled 2016-01-31: qty 56

## 2016-01-31 MED ORDER — LACTATED RINGERS IV SOLN: 500.0000 mL | Freq: Once | INTRAVENOUS | Status: DC

## 2016-01-31 MED ORDER — FENTANYL 2.5 MCG/ML BUPIVACAINE 1/10 % EPIDURAL INFUSION (WH - ANES)
INTRAMUSCULAR | Status: AC
  Filled 2016-01-31: qty 125

## 2016-01-31 MED ORDER — SIMETHICONE 80 MG PO CHEW: 80.0000 mg | CHEWABLE_TABLET | ORAL | Status: DC | PRN

## 2016-01-31 MED ORDER — DIBUCAINE 1 % RE OINT: 1.0000 "application " | TOPICAL_OINTMENT | RECTAL | Status: DC | PRN

## 2016-01-31 MED ORDER — FENTANYL 2.5 MCG/ML BUPIVACAINE 1/10 % EPIDURAL INFUSION (WH - ANES): 14.0000 mL/h | INTRAMUSCULAR | Status: DC | PRN

## 2016-01-31 MED ORDER — ONDANSETRON HCL 4 MG PO TABS: 4.0000 mg | ORAL_TABLET | ORAL | Status: DC | PRN

## 2016-01-31 MED ORDER — WITCH HAZEL-GLYCERIN EX PADS: 1.0000 "application " | MEDICATED_PAD | CUTANEOUS | Status: DC | PRN

## 2016-01-31 MED ORDER — TERBUTALINE SULFATE 1 MG/ML IJ SOLN
0.2500 mg | Freq: Once | INTRAMUSCULAR | Status: DC | PRN
Start: 2016-01-31 — End: 2016-02-01

## 2016-01-31 MED ORDER — PRENATAL MULTIVITAMIN CH
1.0000 | ORAL_TABLET | Freq: Every day | ORAL | Status: DC
Start: 2016-02-01 — End: 2016-02-02
  Administered 2016-02-01: 1 via ORAL
  Filled 2016-01-31: qty 1

## 2016-01-31 MED ORDER — MISOPROSTOL 25 MCG QUARTER TABLET
25.0000 ug | ORAL_TABLET | ORAL | Status: DC | PRN
  Administered 2016-01-31: 25 ug via VAGINAL
  Filled 2016-01-31: qty 0.25
  Filled 2016-01-31: qty 1

## 2016-01-31 MED ORDER — TERBUTALINE SULFATE 1 MG/ML IJ SOLN: 0.2500 mg | Freq: Once | INTRAMUSCULAR | Status: DC | PRN

## 2016-01-31 MED ORDER — ZOLPIDEM TARTRATE 5 MG PO TABS: 5.0000 mg | ORAL_TABLET | Freq: Every evening | ORAL | Status: DC | PRN

## 2016-01-31 MED ORDER — OXYTOCIN 40 UNITS IN LACTATED RINGERS INFUSION - SIMPLE MED
1.0000 m[IU]/min | INTRAVENOUS | Status: DC
  Administered 2016-01-31: 2 m[IU]/min via INTRAVENOUS
  Filled 2016-01-31: qty 1000

## 2016-01-31 MED ORDER — PHENYLEPHRINE 40 MCG/ML (10ML) SYRINGE FOR IV PUSH (FOR BLOOD PRESSURE SUPPORT)
80.0000 ug | PREFILLED_SYRINGE | INTRAVENOUS | Status: DC | PRN
Start: 2016-01-31 — End: 2016-02-01
  Filled 2016-01-31: qty 5

## 2016-01-31 MED ORDER — ONDANSETRON HCL 4 MG/2ML IJ SOLN: 4.0000 mg | INTRAMUSCULAR | Status: DC | PRN

## 2016-01-31 MED ORDER — SODIUM CHLORIDE 0.9% FLUSH: 3.0000 mL | INTRAVENOUS | Status: DC | PRN

## 2016-01-31 MED ORDER — IBUPROFEN 600 MG PO TABS
600.0000 mg | ORAL_TABLET | Freq: Four times a day (QID) | ORAL | Status: DC
  Administered 2016-02-01 – 2016-02-02 (×4): 600 mg via ORAL
  Filled 2016-01-31 (×5): qty 1

## 2016-01-31 MED ORDER — MEASLES, MUMPS & RUBELLA VAC ~~LOC~~ INJ
0.5000 mL | INJECTION | Freq: Once | SUBCUTANEOUS | Status: DC
  Filled 2016-01-31: qty 0.5

## 2016-01-31 MED ORDER — COCONUT OIL OIL: 1.0000 "application " | TOPICAL_OIL | Status: DC | PRN

## 2016-01-31 MED ORDER — SENNOSIDES-DOCUSATE SODIUM 8.6-50 MG PO TABS
2.0000 | ORAL_TABLET | ORAL | Status: DC
  Administered 2016-02-02: 2 via ORAL
  Filled 2016-01-31 (×2): qty 2

## 2016-01-31 NOTE — Anesthesia Pain Management Evaluation Note (Signed)
  CRNA Pain Management Visit Note  Patient: Lester KinsmanQuionte Medero, 25 y.o., female  "Hello I am a [facility] of the anesthesia team at [facility]. We have an anesthesia team available at all times to provide care throughout the hospital, including epidural management and anesthesia for C-section. I don't know your plan for the delivery whether it a natural birth, water birth, IV sedation, nitrous supplementation, doula or epidural, but we want to meet your pain goals."   1.Was your pain managed to your expectations on prior hospitalizations?     2.What is your expectation for pain management during this hospitalization?     3.How can we help you reach that goal?   Record the patient's initial score and the patient's pain goal.   Pain: 0  Pain Goal:  4 [facility] wants you to be able to say your pain was always managed very well.  Laban EmperorMalinova,Jaziyah Gradel Hristova 01/31/2016

## 2016-01-31 NOTE — Progress Notes (Signed)
Labor Progress Note Cindy [name] is a 25 y.o. G1P0000 at 5579w6d presented for pre-eclampsia.  S: Reports mild headache and nausea. Denies vision changes.  O:  BP 156/96 mmHg  Pulse 89  Temp(Src) 98.3 F (36.8 C) (Oral)  Resp 18  Ht 5\' 8"  (1.727 m)  Wt 294 lb (133.358 kg)  BMI 44.71 kg/m2  SpO2 97%  LMP 06/12/2015 (Approximate) EFM: 130/15x15 accels/mod var/no decels  CVE: Dilation: 5 Effacement (%): 50 Station: -3 Presentation: Vertex Exam by:: Marcelino DusterMichelle, RN    A&P: 25 y.o. G1P0000 8579w6d with pre-eclampsia. BP in mild range. Still with mild headache. No vision changes. Just recieved her second dose of BMZ. S/p Mg #Labor: IOL. Cytotec > Pitocin #Pain: tylenol and percocet as needed #FWB: CAT-1 #GBS: Neg  Almon Herculesaye T Gonfa, MD 1:00 PM `````Attestation of Attending Supervision of Advanced Practitioner: Evaluation and management procedures were performed by the PA/NP/CNM/OB Fellow under my supervision/collaboration. Chart reviewed and agree with management and plan.  Keymora Grillot V 01/31/2016 2:18 PM

## 2016-01-31 NOTE — Anesthesia Preprocedure Evaluation (Deleted)
Anesthesia Evaluation  Patient identified by MRN, date of birth, ID band Patient awake    Reviewed: Allergy & Precautions, Patient's Chart, lab work & pertinent test results  Airway Mallampati: III  TM Distance: >3 FB Neck ROM: Full    Dental no notable dental hx. (+) Teeth Intact   Pulmonary neg pulmonary ROS,    Pulmonary exam normal breath sounds clear to auscultation       Cardiovascular hypertension, Pt. on medications Normal cardiovascular exam Rhythm:Regular Rate:Normal     Neuro/Psych negative neurological ROS  negative psych ROS   GI/Hepatic Neg liver ROS, GERD  Medicated and Controlled,  Endo/Other  Morbid obesity  Renal/GU negative Renal ROS  negative genitourinary   Musculoskeletal negative musculoskeletal ROS (+)   Abdominal (+) + obese,   Peds  Hematology  (+) anemia ,   Anesthesia Other Findings   Reproductive/Obstetrics Preterm Labor Pre eclampsia                            Anesthesia Physical Anesthesia Plan  ASA: III  Anesthesia Plan: Epidural   Post-op Pain Management:    Induction:   Airway Management Planned: Natural Airway  Additional Equipment:   Intra-op Plan:   Post-operative Plan:   Informed Consent: I have reviewed the patients History and Physical, chart, labs and discussed the procedure including the risks, benefits and alternatives for the proposed anesthesia with the patient or authorized representative who has indicated his/her understanding and acceptance.     Plan Discussed with: Anesthesiologist  Anesthesia Plan Comments: (No epidural performed. Patient delivered prior to epidural placement.)       Anesthesia Quick Evaluation

## 2016-01-31 NOTE — Progress Notes (Signed)
Labor Progress Note Cindy KinsmanQuionte [name] is a 25 y.o. G1P0000 at 7241w6d presented for pre-eclampsia.  S: no complaint.  O:  BP 134/65 mmHg  Pulse 95  Temp(Src) 98.5 F (36.9 C) (Oral)  Resp 18  Ht 5\' 8"  (1.727 m)  Wt 294 lb (133.358 kg)  BMI 44.71 kg/m2  SpO2 98%  LMP 06/12/2015 (Approximate) EFM: 120/mod var/no decels  CVE: Dilation: 6 Effacement (%): 70 Station: 0 Presentation: Vertex Exam by:: Marcelino DusterMichelle, RN    A&P: 25 y.o. G1P000 4341w6d with pre-eclampsia. BP in low mild range. S/p BMZ x2 #Pre-eclampsia: continue Mg #Labor: progressing. AROM at 18:00. Fluid Clear. IUPC placed. Continue pitocin #Pain: tylenol and percocet as needed #FWB: CAT-1 #GBS: Neg  Almon Herculesaye T Gonfa, MD 7:14 PM

## 2016-02-01 NOTE — Progress Notes (Signed)
Post Partum Day-0 Subjective:  Cindy [name] is a 25 y.o. G1P0101 3567w6d s/p SVD after IOL for Pre-eclampsia.  No acute events overnight.  Pt denies problems with ambulating, voiding or po intake.  She denies headache, vision changes, nausea or vomiting.  Pain is well controlled.  She has had flatus.  Lochia Minimal.  Plan for birth control is IUD (mirena).  Method of Feeding: breast and bottle.   Objective: Blood pressure 142/78, pulse 98, temperature 98.5 F (36.9 C), temperature source Oral, resp. rate 18, height 5\' 8"  (1.727 m), weight 293 lb 8 oz (133.131 kg), last menstrual period 06/12/2015, SpO2 99 %, unknown if currently breastfeeding.  Physical Exam:  General: alert, cooperative and no distress Lochia:normal flow Chest: normal WOB Heart: Regular rate Abdomen: +BS, soft, mild TTP (appropriate) Uterine Fundus: firm, and below belly button DVT Evaluation: No evidence of DVT seen on physical exam. Extremities: no edema   Recent Labs  01/30/16 2050  HGB 10.7*  HCT 32.5*    Assessment/Plan:  ASSESSMENT: Cindy [name] is a 25 y.o. G1P0101 1167w6d s/p SVD after IOL for Pre-eclampsia. SBP in the range of 124-160. DBP in the range of 62-103. Last BP 142/78. Neuro exam within normal limit  Plan for discharge tomorrow   Continue routine PP care Breastfeeding support PRN  LOS: 2 days   Taye T Gonfa 02/01/2016, 8:08 AM   \

## 2016-02-01 NOTE — Progress Notes (Signed)
Initial visit with patient to introduce spiritual care services and offer support after her delivery and medical scare. Offered empathetic ear and listening presence as she shared that the way her daughter, Genesis, arrived into the world was scary and shocking.  She shared that she is still not feeling like herself, but has good support.  Please page as further needs arise.  Cindy ShapeAmanda M. Carley Hammedavee Lomax, M.Div. [facility] Chaplain Pager [phone] Office [phone]

## 2016-02-01 NOTE — Lactation Note (Signed)
This note was copied from a baby's chart. Lactation Consultation Note  Patient Name: Cindy [name] ZOXWR'UToday's Date: 02/01/2016 Reason for consult: Initial assessment;Infant < 6lbs;Late preterm infant   Initial consult with first time mom and 7612 hour old infant. Infant born at 1969w6d GA weighing < 6 lb. Infant weight today 5 lb 14.4 oz with 1% weight loss since birth. Mom plans to BF infant. Mom with history of preeclampsia and is on MgSO4. Infant with 3 BF attempts, 1 void and 1 formula feed by bottle of 5 cc since birth. LATCH Score 4 by bedside RN.  She has a DEBP set up and has pumped a few times, she reports that she has not seen colostrum. Mom has attempted to BF infant and noted that infant pulls on and off and will not latch despite attempts. LPT Infant police presented and reviewed with parents. Handout for LPT infant given.   Infant was quietly alert in crib, she was placed to left breast in cross cradle hold to feed. Mom with large firm breasts and small short shafted nipples. Hand Expression performed and did receive a large gtt colostrum to left breast. Breasts/areola are not easily compressible/stretchable. Infant noted to be tongue sucking and does not extend tongue well while suckling on gloved finger. Taught parents to perform suck training prior to each feeding. I then placed a #20 NS to left breast, showing mom how to place. I primed with 2 cc Alimentum and infant latched easily with rhythmic suckling. She suckled for about 15 minutes needing stimulation at times to stay awake to nurse. Advised parents to use awakening techniques as needed to keep infant awake during BF time. Colostrum was noted in NS after BF. I then tried #24 NS as infant with dimpling at times, she did not stay on as well and no Colostrum noted in NS.   Hand Expression handout given and explained to mom. Reviewed BF basics, positioning, Pumping, cleaning pump parts, milk storage, hand expression, and cup feeding.  Parents were shown how to prime NS and given a curved tip syringe.  Showed parents how to cupfeed infant after BF. Infant tolerated feeding well. Reviewed supplmentation amounts with mom and dad and shown on LPT infant handout.   Plan made with parents and written on board in room, shared with Thayer Ohmhris RN:  Breastfeed infant 8-12 x in 24 hours for 15-20 minutes using #20 NS, awaken at 3 hours if she does not cue to feed Prime NS with EBM/ Formula prior to feeding Supplement infant with EBM/Formula per LPT Infant sheet with cup post BF if not taken enough supplement in NS Pump with DEBP on Initiate setting for 15 minutes post BF Hand Express post pumping Call for assistance as needed     Maternal Data Formula Feeding for Exclusion: No Has patient been taught Hand Expression?: Yes Does the patient have breastfeeding experience prior to this delivery?: No  Feeding Feeding Type: Breast Fed Length of feed: 20 min  LATCH Score/Interventions Latch: Repeated attempts needed to sustain latch, nipple held in mouth throughout feeding, stimulation needed to elicit sucking reflex. (Nipple shield was placed after unable to get infant latched) Intervention(s): Skin to skin;Teach feeding cues;Waking techniques Intervention(s): Breast compression;Breast massage;Assist with latch;Adjust position  Audible Swallowing: A few with stimulation Intervention(s): Skin to skin;Hand expression Intervention(s): Alternate breast massage  Type of Nipple: Everted at rest and after stimulation (Nipples are very small and short shafted)  Comfort (Breast/Nipple): Soft / non-tender  Hold (Positioning): Assistance needed to correctly position infant at breast and maintain latch.  LATCH Score: 7  Lactation Tools Discussed/Used Tools: Nipple Shields;Pump;Feeding cup Nipple shield size: 20;24 Breast pump type: Double-Electric Breast Pump Pump Review: Setup, frequency, and cleaning;Milk Storage Initiated  by:: Bedside RN Date initiated:: 02/01/16   Consult Status Consult Status: Follow-up Date: 02/02/16 Follow-up type: In-patient    Silas Flood Jumana Paccione 02/01/2016, 10:21 AM

## 2016-02-02 ENCOUNTER — Inpatient Hospital Stay (HOSPITAL_COMMUNITY): Admit: 2016-02-02 | Payer: Medicaid Other

## 2016-02-02 MED ORDER — IBUPROFEN 600 MG PO TABS: 600.0000 mg | ORAL_TABLET | Freq: Four times a day (QID) | ORAL | Status: AC

## 2016-02-02 MED ORDER — HYDROCHLOROTHIAZIDE 25 MG PO TABS: 25.0000 mg | ORAL_TABLET | Freq: Every day | ORAL | Status: DC

## 2016-02-02 NOTE — Lactation Note (Signed)
This note was copied from a baby's chart. Lactation Consultation Note RN called w/concerns that mom is frustrated and giving up on BF. Feels overwhelmed. Had concerns that she didn't have enough BM for the baby. Moms breast are feeling heavy, explained breast filling w/colostrum. Mom hand massaged breast and encouraged hand expression. Baby had been fussy all night mom stated. Unable to latch baby to breast w/o NS. Hand expression demonstrated colostrum. Formula inserted into NS and latched baby in football position. Encouraged "C" hold for latching then occasionally massage breast during feeding if baby gets sleepy. Mom noted difference after feeding in breast compared to other breast. Mom teared up and stated she didn't want to BF, she has tried it and she didn't want to do it anymore. Mom stated it was to much for her and it was more to it than she thought. Explained BF is different for everybody. Mom stated she wanted to pump and bottle feed baby. Discussed supply and demand, milk storage, filling, engorgement, I&O. Mom has DEBP at home. Discussed pumping and supplementing until pumps enough milk to stop formula. Reported to RN moms decision. RN stated that mom wasn't leaning to BF.  Patient Name: Cindy Lester KinsmanQuionte Mankey XBJYN'WToday's Date: 02/02/2016 Reason for consult: Follow-up assessment;Infant < 6lbs;Late preterm infant   Maternal Data    Feeding Feeding Type: Breast Milk with Formula added Length of feed: 15 min  LATCH Score/Interventions Latch: Grasps breast easily, tongue down, lips flanged, rhythmical sucking. Intervention(s): Teach feeding cues;Waking techniques Intervention(s): Adjust position;Assist with latch;Breast massage;Breast compression  Audible Swallowing: A few with stimulation Intervention(s): Skin to skin;Hand expression Intervention(s): Alternate breast massage  Type of Nipple: Flat Intervention(s): Double electric pump  Comfort (Breast/Nipple): Soft / non-tender      Hold (Positioning): Assistance needed to correctly position infant at breast and maintain latch. Intervention(s): Breastfeeding basics reviewed;Support Pillows;Position options;Skin to skin  LATCH Score: 7  Lactation Tools Discussed/Used Tools: Nipple Dorris CarnesShields;Pump Nipple shield size: 20 Breast pump type: Double-Electric Breast Pump   Consult Status Consult Status: Follow-up Date: 02/02/16 (in pm) Follow-up type: In-patient    Charyl DancerCARVER, Jillyn Stacey G 02/02/2016, 4:27 AM

## 2016-02-02 NOTE — Progress Notes (Signed)
Pt received discharge instructions. Patient verbalizes and understanding. Pt states that she was told by the doctor this morning that she would receive a prescription for something to "get rid of her fluid". Faculty practice notified and instructed that they will look into it. Carmelina DaneERRI L Chrsitopher Wik, RN

## 2016-02-02 NOTE — Discharge Summary (Signed)
OB Discharge Summary     Patient Name: Cindy KinsmanQuionte Salls DOB: 12/29/1990 MRN: [id number]  Date of admission: 01/30/2016 Delivering MD: Zerita BoersLAWSON, DARLENE   Date of discharge: 02/02/2016  Admitting diagnosis: 36w vomiting sudden swelling, feeling dizzy Intrauterine pregnancy: 4135w6d     Secondary diagnosis:  Principal Problem:   NSVD (normal spontaneous vaginal delivery) Active Problems:   Supervision of normal first pregnancy in second trimester   Abnormal fetal ultrasound   Preeclampsia  Additional problems: none     Discharge diagnosis: Term Pregnancy Delivered and Preeclampsia (severe)                                                                                                Post partum procedures:None  Augmentation: AROM, Pitocin, Cytotec and Foley Balloon  Complications: None  Hospital course:  Induction of Labor With Vaginal Delivery   25 y.o. yo G1P0101 at 7535w6d was admitted to the hospital 01/30/2016 for induction of labor.  Indication for induction: Preeclampsia with severe features and patient was on magnesium in labor.  Patient had an uncomplicated labor course as follows: Membrane Rupture Time/Date: 6:00 PM ,01/31/2016   Intrapartum Procedures: Episiotomy: None [1]                                         Lacerations:  None [1]  Patient had delivery of a Viable infant.  Information for the patient's newborn:  Nance PearBryant, Girl Jahmya [[id number]][[id number]]  Delivery Method: Vaginal, Spontaneous Delivery (Filed from Delivery Summary)    01/31/2016  Details of delivery can be found in separate delivery note.  Patient had a routine postpartum course. Patient is discharged home 02/02/2016.   Physical exam  Filed Vitals:   02/01/16 2000 02/01/16 2220 02/02/16 0154 02/02/16 0600  BP:  132/77 147/73 140/90  Pulse:  80 90 75  Temp:  98.9 F (37.2 C) 98.6 F (37 C) 98.6 F (37 C)  TempSrc:  Oral Oral Oral  Resp: 17 18 18 18   Height:      Weight:    297 lb (134.718 kg)  SpO2:   97% 97% 96%   General: alert, cooperative and no distress Lochia: appropriate Uterine Fundus: firm Incision: N/A DVT Evaluation: No evidence of DVT seen on physical exam. Negative Homan's sign. Labs: Lab Results  Component Value Date   WBC 8.9 01/30/2016   HGB 10.7* 01/30/2016   HCT 32.5* 01/30/2016   MCV 81.0 01/30/2016   PLT 228 01/30/2016   CMP Latest Ref Rng 01/30/2016  Glucose 65 - 99 mg/dL 72  BUN 6 - 20 mg/dL 8  Creatinine 7.820.44 - 9.561.00 mg/dL 2.130.74  Sodium 086135 - 578145 mmol/L 136  Potassium 3.5 - 5.1 mmol/L 4.3  Chloride 101 - 111 mmol/L 104  CO2 22 - 32 mmol/L 23  Calcium 8.9 - 10.3 mg/dL 4.6(N8.8(L)  Total Protein 6.5 - 8.1 g/dL 6.4(L)  Total Bilirubin 0.3 - 1.2 mg/dL 0.5  Alkaline Phos 38 - 126 U/L 164(H)  AST 15 - 41  U/L 19  ALT 14 - 54 U/L 12(L)    Discharge instruction: per After Visit Summary and "Baby and Me Booklet".  After visit meds:    Medication List    STOP taking these medications        ranitidine 150 MG tablet  Commonly known as:  ZANTAC      TAKE these medications        acetaminophen 325 MG tablet  Commonly known as:  TYLENOL  Take 650 mg by mouth every 6 (six) hours as needed.     ibuprofen 600 MG tablet  Commonly known as:  ADVIL,MOTRIN  Take 1 tablet (600 mg total) by mouth every 6 (six) hours.     multivitamin-prenatal 27-0.8 MG Tabs tablet  Take 1 tablet by mouth daily at 12 noon.        Diet: routine diet  Activity: Advance as tolerated. Pelvic rest for 6 weeks.   Outpatient follow up:1 week BP check, 6 weeks for postpartum appointment Follow up Appt:No future appointments. Follow up Visit:No Follow-up on file.  Postpartum contraception: IUD Mirena  Newborn Data: Live born female  Birth Weight: 5 lb 15.1 oz (2696 g) APGAR: 7, 9  Baby Feeding: Breast Disposition:NICU   02/02/2016 Federico Flake, MD

## 2016-02-03 ENCOUNTER — Encounter: Payer: Medicaid Other | Admitting: Family

## 2016-02-09 ENCOUNTER — Inpatient Hospital Stay (HOSPITAL_COMMUNITY): Payer: Medicaid Other

## 2016-02-12 ENCOUNTER — Ambulatory Visit: Payer: Medicaid Other

## 2016-02-12 VITALS — BP 125/81 | HR 89

## 2016-02-12 DIAGNOSIS — Z0130 Encounter for examination of blood pressure without abnormal findings: Secondary | ICD-10-CM

## 2016-02-12 NOTE — Progress Notes (Signed)
Pt presented today for her Blood Pressure Check. She is currently doing well and her blood pressure is stable at 125/81. Pt will follow up at postpartum check up.

## 2016-03-17 ENCOUNTER — Ambulatory Visit: Payer: Medicaid Other | Admitting: Obstetrics and Gynecology

## 2017-07-27 IMAGING — US US MFM OB FOLLOW-UP
1 series · 14 of 28 positions shown · non-contrast
Comparison: none

[Series 1: us mfm ob follow-up · 47 acquisitions, 14 frames shown]
[im 2/47]
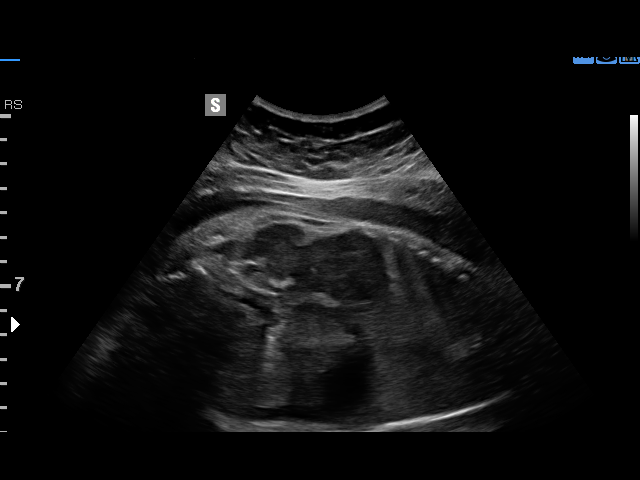
[im 6/47]
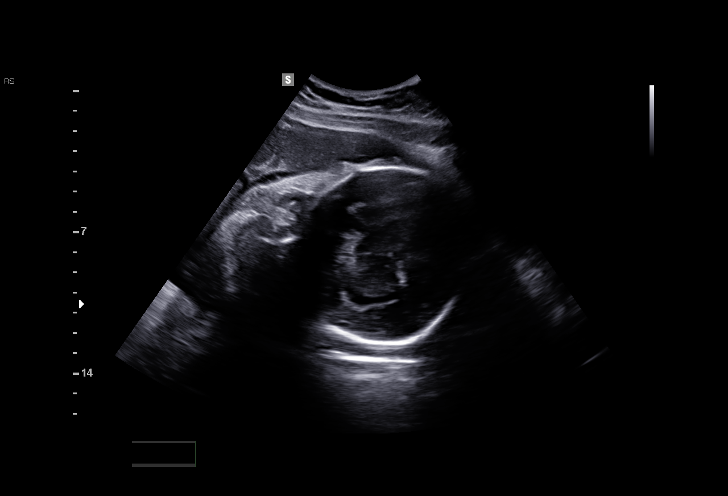
[im 9/47]
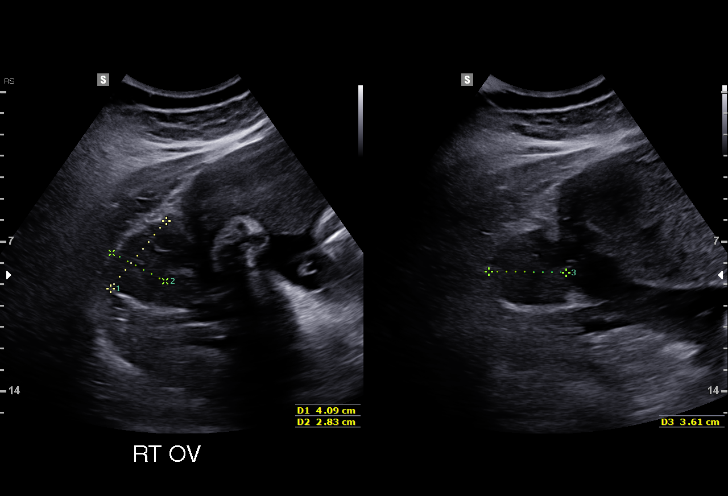
[im 12/47]
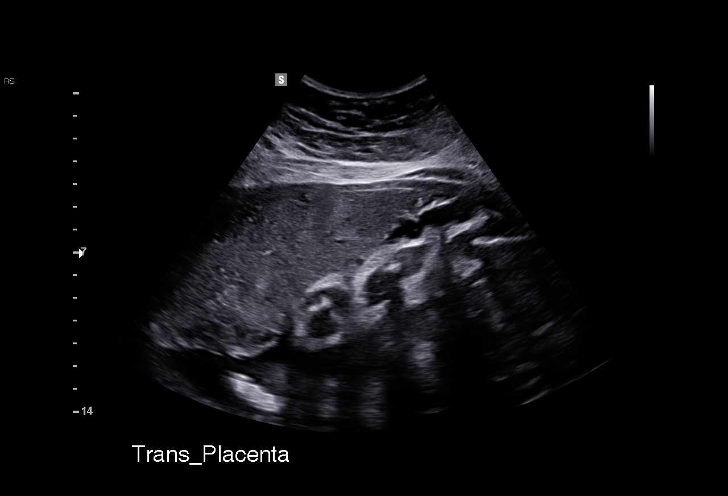
[im 16/47]
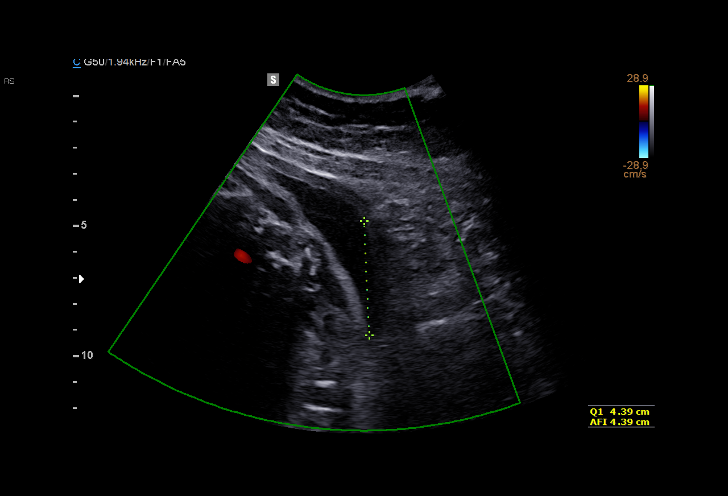
[im 19/47]
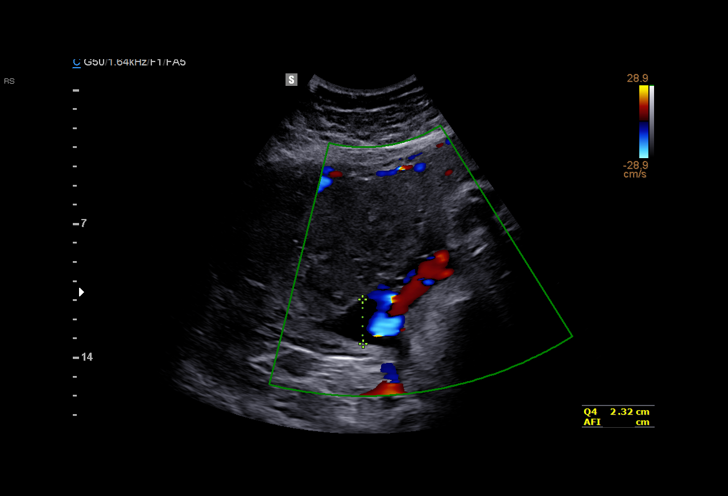
[im 23/47]
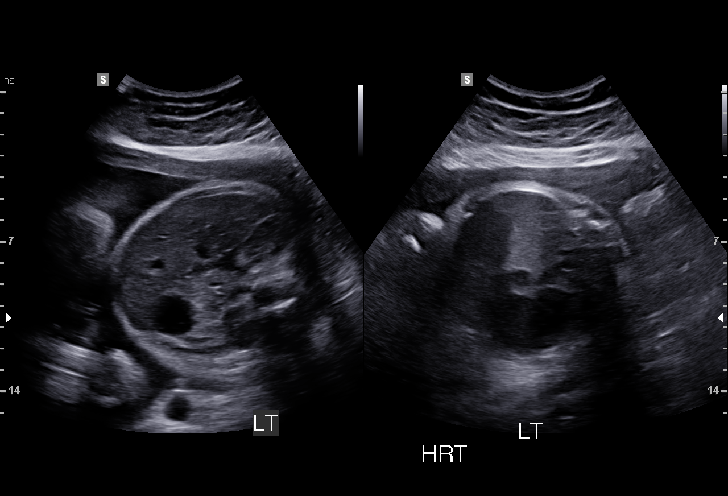
[im 26/47]
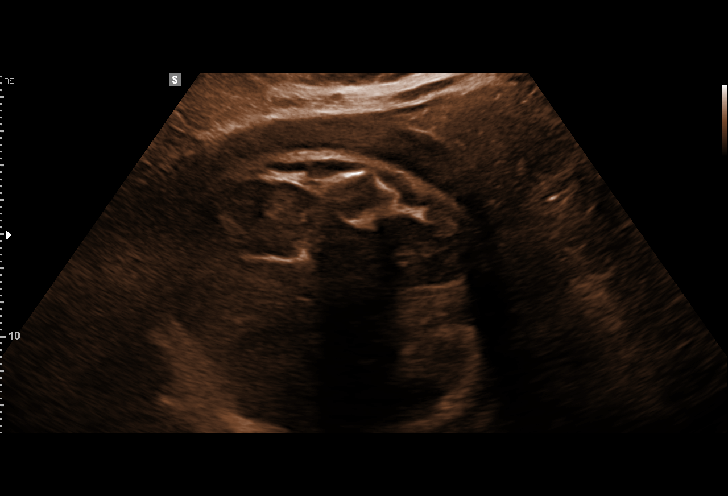
[im 29/47]
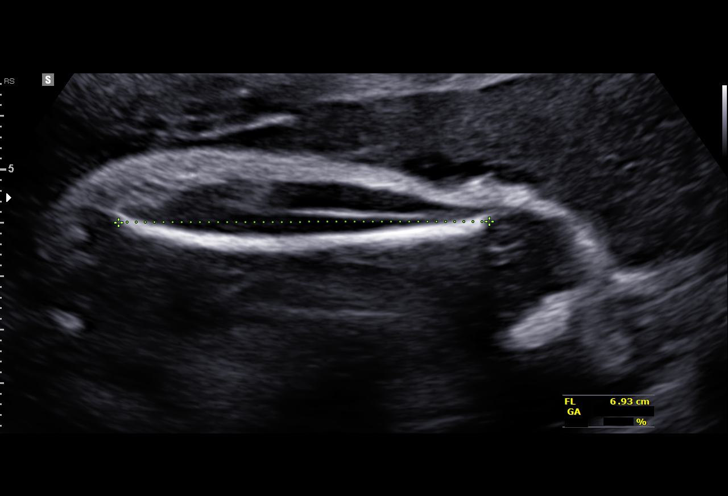
[im 33/47]
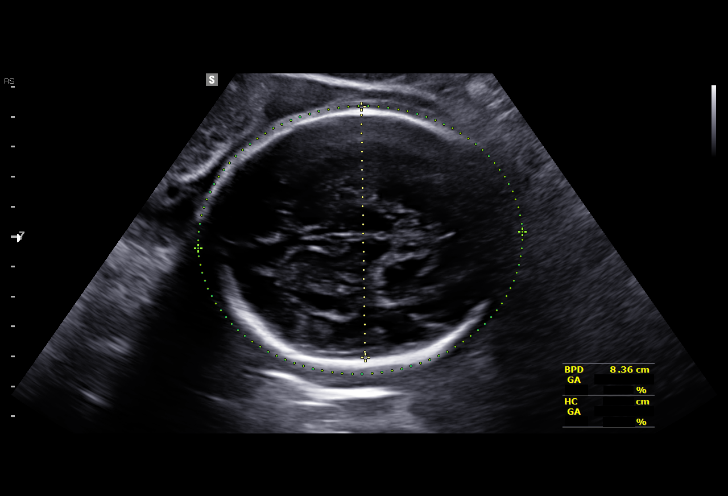
[im 36/47]
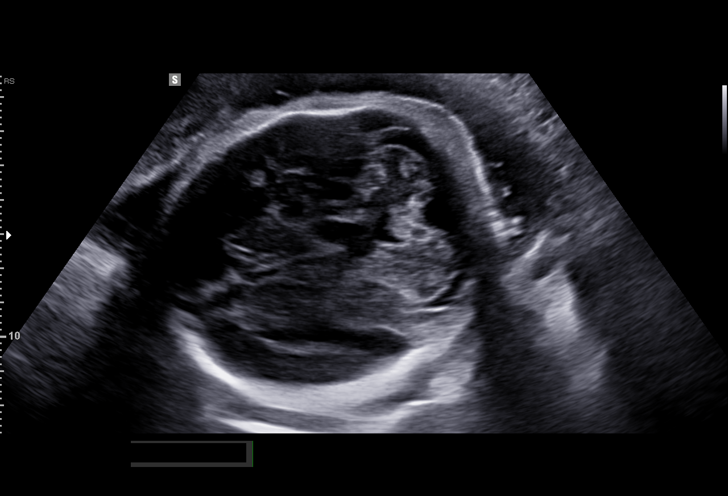
[im 40/47]
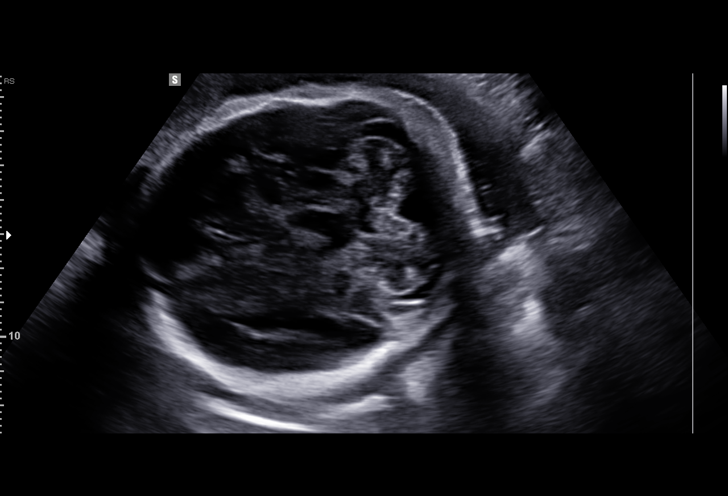
[im 43/47]
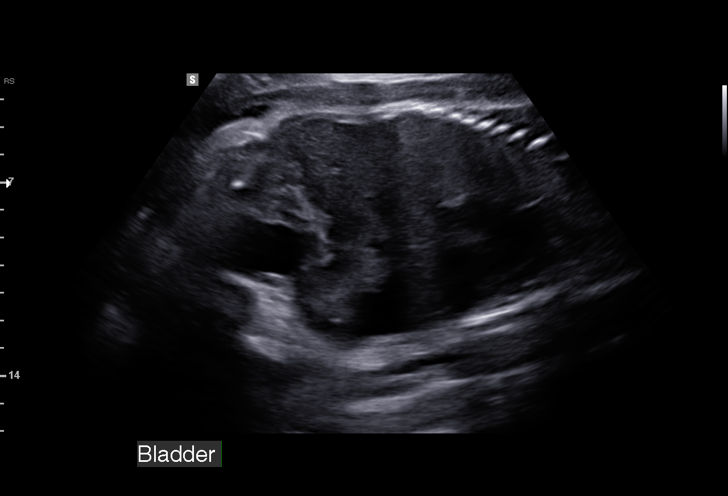
[im 47/47]
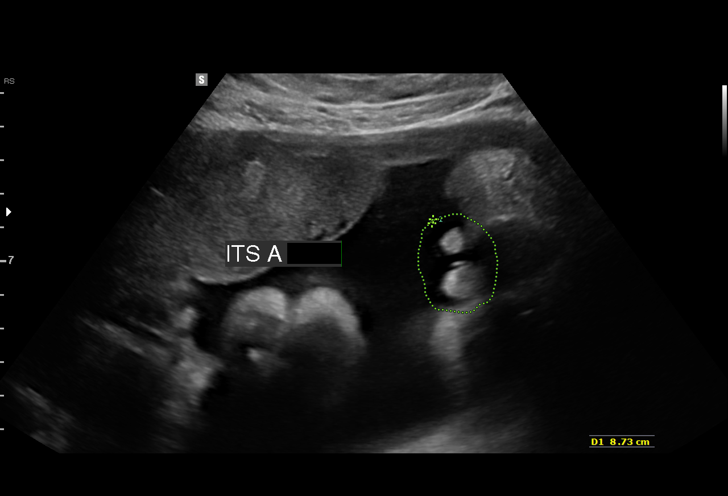

[14 of 28 positions shown; findings below may reference images not displayed]

Hospital Clinic-
Faculty Physician
OB/Gyn Clinic

1  [facility] B[facility]            [id number]      [id number]     [id number]
Indications

34 weeks gestation of pregnancy
Fetal abnormality - other known or
suspected (cerebellar vermis hypoplasia)
Obesity complicating pregnancy, third
trimester
OB History

Gravidity:    1         Term:   0        Prem:   0        SAB:   0
TOP:          0       Ectopic:  0        Living: 0
Fetal Evaluation

Num Of Fetuses:     1
Fetal Heart         143
Rate(bpm):
Cardiac Activity:   Observed
Presentation:       Cephalic
Placenta:           Anterior, above cervical os

Amniotic Fluid
AFI FV:      Subjectively within normal limits

AFI Sum(cm)     %Tile       Largest Pocket(cm)
16.37           59
RUQ(cm)       RLQ(cm)       LUQ(cm)        LLQ(cm)
4.39
Biometry

BPD:      83.6  mm     G. Age:  33w 4d         27  %    CI:         73.1   %   70 - 86
FL/HC:      22.1   %   19.4 -
HC:      310.8  mm     G. Age:  34w 5d         23  %    HC/AC:      1.00       0.96 -
AC:      310.5  mm     G. Age:  35w 0d         71  %    FL/BPD:     82.1   %   71 - 87
FL:       68.6  mm     G. Age:  35w 1d         62  %    FL/AC:      22.1   %   20 - 24
HUM:      63.1  mm     G. Age:  36w 5d       > 95  %

Est. FW:    0067  gm    5 lb 10 oz      70  %
Gestational Age

LMP:           30w 5d       Date:   06/12/15                 EDD:   03/18/16
U/S Today:     34w 4d                                        EDD:   02/20/16
Best:          34w 3d    Det. By:   U/S  (10/28/15)          EDD:   02/21/16
Anatomy

Cranium:               Previously seen        Aortic Arch:            Previously seen
Cavum:                 Previously seen        Ductal Arch:            Previously seen
Ventricles:            Appears normal         Diaphragm:              Previously seen
Choroid Plexus:        Previously seen        Stomach:                Appears normal, left
sided
Cerebellum:            Appears normal         Abdomen:                Previously seen
Posterior Fossa:       Previously seen        Abdominal Wall:         Previously seen
Nuchal Fold:           Not applicable (>20    Cord Vessels:           Previously seen
wks GA)
Face:                  Orbits and profile     Kidneys:                Appear normal
previously seen
Lips:                  Previously seen        Bladder:                Appears normal
Thoracic:              Appears normal         Spine:                  Previously seen
Heart:                 Previously seen        Upper Extremities:      Previously seen
RVOT:                  Previously seen        Lower Extremities:      Previously seen
LVOT:                  Previously seen

Other:  Fetus appears to be a female. Technically difficult due to fetal position.
Cervix Uterus Adnexa

Cervix
Not visualized (advanced GA >15wks)

Uterus
No abnormality visualized.

Left Ovary
Not visualized.

Right Ovary
Not visualized.

Adnexa:       No abnormality visualized. No adnexal mass
visualized.
Impression

SIUP at 34+3 weeks
Normal interval anatomy; anatomic survey complete
Normal amniotic fluid volume
Appropriate interval growth with EFW at the 70th %tile
Recommendations

Follow-up as clinically indicated
Postnatal evaluation of PF per newborn providers

## 2017-11-20 ENCOUNTER — Encounter: Payer: Self-pay | Admitting: *Deleted
